# Patient Record
Sex: Female | Born: 1955 | Race: White | Hispanic: No | Marital: Single | Smoking: Former smoker
Health system: Southern US, Community
[De-identification: ages and names within clinical notes are randomized; demographics above are authoritative.]

## PROBLEM LIST (undated history)

## (undated) DIAGNOSIS — Z8619 Personal history of other infectious and parasitic diseases: Secondary | ICD-10-CM

## (undated) HISTORY — PX: CARPAL TUNNEL RELEASE: SHX101

## (undated) HISTORY — PX: REPLACEMENT TOTAL KNEE BILATERAL: SUR1225

## (undated) HISTORY — PX: ABDOMINAL HYSTERECTOMY: SHX81

## (undated) HISTORY — DX: Personal history of other infectious and parasitic diseases: Z86.19

---

## 1997-11-22 ENCOUNTER — Other Ambulatory Visit: Admission: RE | Admit: 1997-11-22 | Discharge: 1997-11-22 | Payer: Self-pay | Admitting: Gynecology

## 1999-08-21 ENCOUNTER — Encounter: Payer: Self-pay | Admitting: Gynecology

## 1999-08-21 ENCOUNTER — Encounter: Admission: RE | Admit: 1999-08-21 | Discharge: 1999-08-21 | Payer: Self-pay | Admitting: Gynecology

## 2004-09-15 ENCOUNTER — Other Ambulatory Visit: Admission: RE | Admit: 2004-09-15 | Discharge: 2004-09-15 | Payer: Self-pay | Admitting: Gynecology

## 2004-09-18 ENCOUNTER — Ambulatory Visit (HOSPITAL_COMMUNITY): Admission: RE | Admit: 2004-09-18 | Discharge: 2004-09-18 | Payer: Self-pay | Admitting: Gynecology

## 2004-12-14 ENCOUNTER — Inpatient Hospital Stay (HOSPITAL_COMMUNITY): Admission: RE | Admit: 2004-12-14 | Discharge: 2004-12-17 | Payer: Self-pay | Admitting: Orthopedic Surgery

## 2005-12-10 ENCOUNTER — Ambulatory Visit (HOSPITAL_COMMUNITY): Admission: RE | Admit: 2005-12-10 | Discharge: 2005-12-10 | Payer: Self-pay | Admitting: Gynecology

## 2005-12-13 ENCOUNTER — Other Ambulatory Visit: Admission: RE | Admit: 2005-12-13 | Discharge: 2005-12-13 | Payer: Self-pay | Admitting: Gynecology

## 2007-01-12 ENCOUNTER — Ambulatory Visit (HOSPITAL_COMMUNITY): Admission: RE | Admit: 2007-01-12 | Discharge: 2007-01-12 | Payer: Self-pay | Admitting: Gynecology

## 2007-01-18 ENCOUNTER — Encounter: Admission: RE | Admit: 2007-01-18 | Discharge: 2007-01-18 | Payer: Self-pay | Admitting: Gynecology

## 2007-01-24 ENCOUNTER — Other Ambulatory Visit: Admission: RE | Admit: 2007-01-24 | Discharge: 2007-01-24 | Payer: Self-pay | Admitting: Gynecology

## 2008-11-18 ENCOUNTER — Inpatient Hospital Stay (HOSPITAL_COMMUNITY): Admission: RE | Admit: 2008-11-18 | Discharge: 2008-11-20 | Payer: Self-pay | Admitting: Orthopedic Surgery

## 2009-01-09 ENCOUNTER — Ambulatory Visit (HOSPITAL_COMMUNITY): Admission: RE | Admit: 2009-01-09 | Discharge: 2009-01-09 | Payer: Self-pay | Admitting: Gynecology

## 2010-10-20 LAB — DIFFERENTIAL
Basophils Absolute: 0 10*3/uL (ref 0.0–0.1)
Eosinophils Absolute: 0.2 10*3/uL (ref 0.0–0.7)
Eosinophils Relative: 3 % (ref 0–5)
Lymphocytes Relative: 29 % (ref 12–46)
Lymphs Abs: 1.8 10*3/uL (ref 0.7–4.0)
Neutro Abs: 3.7 10*3/uL (ref 1.7–7.7)
Neutrophils Relative %: 59 % (ref 43–77)

## 2010-10-20 LAB — BASIC METABOLIC PANEL
BUN: 12 mg/dL (ref 6–23)
BUN: 7 mg/dL (ref 6–23)
CO2: 28 mEq/L (ref 19–32)
Calcium: 8.7 mg/dL (ref 8.4–10.5)
Calcium: 8.7 mg/dL (ref 8.4–10.5)
Creatinine, Ser: 0.76 mg/dL (ref 0.4–1.2)
GFR calc Af Amer: >60 mL/min (ref >60)
GFR calc non Af Amer: >60 mL/min (ref >60)
GFR calc non Af Amer: >60 mL/min (ref >60)
Glucose, Bld: 115 mg/dL — ABNORMAL HIGH (ref 70–99)
Sodium: 138 mEq/L (ref 135–145)
Sodium: 140 mEq/L (ref 135–145)

## 2010-10-20 LAB — URINALYSIS, ROUTINE W REFLEX MICROSCOPIC
Bilirubin Urine: NEGATIVE
Glucose, UA: NEGATIVE mg/dL
Ketones, ur: NEGATIVE mg/dL
Protein, ur: NEGATIVE mg/dL
Urobilinogen, UA: 0.2 mg/dL (ref 0.0–1.0)

## 2010-10-20 LAB — COMPREHENSIVE METABOLIC PANEL
Albumin: 3.8 g/dL (ref 3.5–5.2)
Alkaline Phosphatase: 86 U/L (ref 39–117)
BUN: 15 mg/dL (ref 6–23)
CO2: 27 mEq/L (ref 19–32)
Calcium: 9.5 mg/dL (ref 8.4–10.5)
Chloride: 110 mEq/L (ref 96–112)
Creatinine, Ser: 0.86 mg/dL (ref 0.4–1.2)
GFR calc Af Amer: >60 mL/min (ref >60)
GFR calc non Af Amer: >60 mL/min (ref >60)
Glucose, Bld: 107 mg/dL — ABNORMAL HIGH (ref 70–99)
Total Bilirubin: 0.3 mg/dL (ref 0.3–1.2)

## 2010-10-20 LAB — PROTIME-INR
INR: 1 (ref 0.00–1.49)
INR: 1.2 (ref 0.00–1.49)
INR: 1.3 (ref 0.00–1.49)
Prothrombin Time: 13.3 seconds (ref 11.6–15.2)
Prothrombin Time: 15.5 seconds — ABNORMAL HIGH (ref 11.6–15.2)
Prothrombin Time: 16.4 seconds — ABNORMAL HIGH (ref 11.6–15.2)

## 2010-10-20 LAB — CBC
HCT: 30.3 % — ABNORMAL LOW (ref 36.0–46.0)
HCT: 37.2 % (ref 36.0–46.0)
Hemoglobin: 12.8 g/dL (ref 12.0–15.0)
Hemoglobin: 9.6 g/dL — ABNORMAL LOW (ref 12.0–15.0)
MCHC: 34.5 g/dL (ref 30.0–36.0)
MCHC: 34.9 g/dL (ref 30.0–36.0)
MCHC: 35.1 g/dL (ref 30.0–36.0)
MCV: 85.1 fL (ref 78.0–100.0)
Platelets: 184 10*3/uL (ref 150–400)
Platelets: 239 10*3/uL (ref 150–400)
RBC: 3.22 MIL/uL — ABNORMAL LOW (ref 3.87–5.11)
RBC: 4.37 MIL/uL (ref 3.87–5.11)
RDW: 12.6 % (ref 11.5–15.5)
WBC: 6.4 10*3/uL (ref 4.0–10.5)
WBC: 7.9 10*3/uL (ref 4.0–10.5)
WBC: 9.7 10*3/uL (ref 4.0–10.5)

## 2010-10-20 LAB — TYPE AND SCREEN: Antibody Screen: NEGATIVE

## 2010-10-20 LAB — ABO/RH: ABO/RH(D): B POS

## 2010-10-20 LAB — URINE CULTURE

## 2010-11-24 NOTE — Op Note (Signed)
NAME:  Meghan Martinez, Meghan Martinez                   ACCOUNT NO.:  [id number]   MEDICAL RECORD NO.:  [id number]          PATIENT TYPE:  INP   LOCATION:  5024                         FACILITY:  [facility]   PHYSICIAN:  Robert A. Thurston Hole, M.D. DATE OF BIRTH:  Aug 13, 1955   DATE OF PROCEDURE:  11/18/2008  DATE OF DISCHARGE:                               OPERATIVE REPORT   PREOPERATIVE DIAGNOSIS:  Right knee degenerative joint disease.   POSTOPERATIVE DIAGNOSIS:  Right knee degenerative joint disease.   PROCEDURE:  Right total knee replacement using DePuy cemented total knee  system with #4 cemented femur and #4 cemented tibia with 12.5-mm  polyethylene RP tibial spacer and 35-mm polyethylene cemented patella.   SURGEON:  Elana Alm. Thurston Hole, MD   ASSISTANT:  Julien Girt, PA   ANESTHESIA:  General.   OPERATIVE TIME:  One hour and 30 minutes.   COMPLICATIONS:  None.   DESCRIPTION OF PROCEDURE:  Ms. Punch was brought to the operating room on  Nov 18, 2008.  After a femoral nerve block, was placed in the holding  room by anesthesia.  She was placed in the operative table in supine  position.  After being placed under general anesthesia, she received  Ancef 2 g IV preoperatively for prophylaxis.  She had a Foley catheter  placed under sterile conditions.  Her right knee was examined under  anesthesia.  Range of motion from -5 to 125 degrees.  Moderate varus  deformity, knee stable, and ligamentous exam with normal patellar  tracking.  The right leg was prepped using sterile DuraPrep and draped  using sterile technique.  Originally, through a 15-cm longitudinal  incision based over the patella, initial exposure was made.  The  underlying subcutaneous tissues were incised along with skin incision.  A median arthrotomy was performed revealing an excessive amount of  normal-appearing joint fluid.  The articular surfaces were inspected.  She had grade 4 changes medially, laterally, and in the patellofemoral  joint.  Osteophytes removed from the femoral condyles and tibial  plateau.  The medial and lateral meniscal remnants were removed as well  as the anterior cruciate ligament.  An intramedullary drill was then  drilled up the femoral canal for placement of the distal femoral cutting  jig, which was placed in the appropriate manner rotation and a distal 11  mm cut was made.  The distal femur was then sized.  A #4 was found to be  the appropriate size.  A #4 cutting jig was then placed in the  appropriate manner of external rotation and then these cuts were made.  The proximal tibia was then exposed.  The tibial spines were removed  with an oscillating saw.  Intramedullary drill was drilled down the  tibial canal for placement of proximal tibial cutting jig, which was  placed in the appropriate manner rotation, and a proximal 6 mm cut was  made based off the medial or lower side.  Spacer blocks were then placed  in flexion and extension.  A 12.5 mm blocks gave excellent balancing,  excellent stability, and excellent correction  of her flexion and varus  deformities.  A #4 tibial base plate trial was placed on the cut tibial  surface with an excellent fit, and the keel cut was made.  The PCL box  cutter was then placed on the distal femur and these cuts were made.  At  this point, the #4 tibial base plate trial was placed and #4 femoral  trial was placed in with a 12.5-mm polyethylene tibial spacer, knee was  reduced, taken through range of motion from 0-125 degrees with excellent  stability and excellent correction of her flexion and varus deformities,  and normal patella tracking.  A resurfacing 10-mm cut was then made on  the patella and 3 locking holes placed for a 35 mm patella.  The patella  trial was placed and again patellofemoral tracking was evaluated and  found to be normal.  At this point, it was felt that all trial  components were of excellent size, fit, and stability.  They were  then  removed.  The knee was then jet lavaged and irrigated with 3 L of  saline.  The proximal tibia was then exposed.  A #4 tibial baseplate  with cement backing was hammered into position with an excellent fit  with excess cement being removed from around the edges.  A #4 femoral  component with cement backing was hammered into position also with an  excellent fit with excess cement being removed from around the edges.  A  12.5-mm polyethylene RP tibial spacer was placed on tibial baseplate,  the knee reduced, taken through range of motion from 0-125 degrees with  excellent stability and excellent correction of her flexion and varus  deformities.  The 35-mm polyethylene cement backed patella was then  placed in its position and held there with a clamp.  After the cement  hardened, again patellofemoral tracking was evaluated and found to be  normal.  At this point, it was felt that all components were of  excellent size, fit, and stability.  The knee was further irrigated with  saline.  The tourniquet was released.  Hemostasis was obtained with  cautery.  The arthrotomy was then closed with #1 Ethibond suture over 2  medium Hemovac drains.  Subcutaneous tissues were closed with 0 and 2-0  Vicryl.  Subcuticular layer closed with 4-0 Monocryl.  Sterile dressings  were applied.  Fabrizio-leg splint applied.  The patient then awakened,  extubated, and taken to recovery room in stable condition.  Needle and  sponge counts correct x2 at the end of case.  Neurovascular status  normal.  Pulses 2+ and symmetric.      Robert A. Thurston Hole, M.D.  Electronically Signed     RAW/MEDQ  D:  11/18/2008  T:  11/18/2008  Job:  [id number]

## 2010-11-27 NOTE — Op Note (Signed)
NAME:  Meghan Martinez, Meghan Martinez                   ACCOUNT NO.:  [id number]   MEDICAL RECORD NO.:  [id number]          PATIENT TYPE:  INP   LOCATION:  5018                         FACILITY:  [facility]   PHYSICIAN:  Robert A. Thurston Hole, M.D. DATE OF BIRTH:  Jun 14, 1956   DATE OF PROCEDURE:  12/14/2004  DATE OF DISCHARGE:                                 OPERATIVE REPORT   PREOPERATIVE DIAGNOSIS:  Left knee degenerative joint disease.   POSTOPERATIVE DIAGNOSIS:  Left knee degenerative joint disease.   OPERATION PERFORMED:  1.  Left total knee replacement using Depuy cemented total knee system with      a #4 cemented femur, #4 cemented tibia with 15 mm polyethylene RP tibial      spacer and 38 mm polyethylene cemented patella.  2.  Left total knee computer assisted navigation.   SURGEON:  Elana Alm. Thurston Hole, M.D.   ASSISTANT:  Julien Girt, P.A.   ANESTHESIA:  General.   OPERATIVE TIME:  One hour and 45 minutes.   COMPLICATIONS:  None.   DESCRIPTION OF PROCEDURE:  [name] was brought to the operating room on  December 14, 2004 and placed on the operating table in supine position.  After an  adequate level of general anesthesia was obtained, she received Ancef 1 g IV  preoperatively for prophylaxis.  She had a Foley catheter placed under  sterile conditions.  Her left knee was examination under anesthesia.  Range  of motion from -10 to 105 degrees with significant varus deformity of 8 to  10 degrees.  Her knee was stable with normal patellar tracking.  Her left  leg was prepped using sterile DuraPrep and draped using technique.  The leg  was exsanguinated and a thigh tourniquet elevated 375 mm.  Initially,  through a 12 cm longitudinal incision based over the patella, initial  exposure was made.  The underlying subcutaneous tissues were incised in line  with the skin incision.  A median arthrotomy was performed revealing an  excessive amount of normal-appearing joint fluid.  The articular surfaces  were inspected.  She had grade 4 changes medially, laterally and in the  patellofemoral joint.  She had very large osteophytes on the femoral  condyles and tibial plateau and these were removed.  Medial and lateral  meniscal remnants were removed as well as pieces of the anterior cruciate  ligament of which only part of this remained.  After this was done, two pins  were placed in the distal femoral shaft and two other pins were placed in  the proximal tibial shaft for placement of the computer assisted navigation  devices.  These devices were then activated.  Initial measurements from this  showed approximately 10 degrees of varus deformity and 10 to 12 degrees of  flexion contracture.  At this point then using the computer assisted  navigation, a distal 12 mm cut was made on the distal femur.  The distal  femur was incised.  #4 was found to be the appropriate size.  The #4 cutting  jig was placed and then these cuts were made  and verified again with  computer navigation showing excellent and perfect cuts.  After this was  done, then the proximal tibia was exposed.  Using again a computer  navigation system, the proximal tibial cut was made removing 6.5 mm from the  medial or lower side which equaled 10 mm from the lateral side again with  appropriate and accurate cuts made using the computer navigation system.  At  this point then spacer guides were used to test flexion and extension gaps  and with a 15 mm gap spacer there was found to be excellent balance.  At  this point then the proximal tibial keel cut was made with the #4 tibial  tray trial.  After this was done then the PCL box cutter was placed on the  distal femur and these cuts were made.  At this point then the #4 femoral  trial was placed and with a #4 tibial base plate trial placed in a 15 mm  polyethylene RP tibial spacer.  The knee was taken through a full range of  motion, found to have excellent correction of her varus  deformity and her  flexion contracture to within 1 degree of neutral.  The knee was found to be  stable.  The patella was then sized.  38 mm was found to be the appropriate  size and a resurfacing cut was made and three locking holes were placed. At  this point then patellofemoral tracking was evaluated and this was found to  be normal.  At this point it was felt that all of the trial components were  of excellent size, fit and stability.  They were then removed.  The knee was  then jet lavage irrigated with 3 liters of saline solution.  The proximal  tibia was then exposed and a #4 tibial baseplate with cement backing was  hammered into position with an excellent fit with excess cement being  removed from around the edges.  The #4 femoral component with cement backing  was hammered into position also with an excellent fit with excess cement  being removed from around the edges.  The 15 mm polyethylene RP tibial  spacer was then placed on the tibial base plate.  The knee taken through a  range of motion of motion from 0 to 120 degrees with excellent stability and  excellent correction of her varus deformity and of her flexion contracture.  The 38 mm polyethylene cement backed patella was then placed onto its cut  surface with three pegs in good position and held in place with a clamp.  After the cement had hardened, the patellofemoral tracking was again  evaluated.  This was found to be normal.  At this point it was felt that all  components were of excellent size, fit and stability.  The pins for the  navigation system were removed.  The wounds were further irrigated with  saline.  The tourniquet was released.  Hemostasis was obtained with cautery.  Then the arthrotomy was closed with #1 Ethibond suture over two medium  Hemovac drains.  Subcutaneous tissue was closed with 0 and 2-0 Vicryl.  Skin  closed with skin staples.  Sterile dressings were applied and a Molock leg splint.  The patient  had had a femoral nerve block placed by anesthesia for  postoperative pain control.  She was then awakened, extubated and taken to  recovery room in stable condition.  Sponge and needle counts were correct  times two at the end  of this case.       RAW/MEDQ  D:  12/14/2004  T:  12/14/2004  Job:  [id number]

## 2010-11-27 NOTE — Discharge Summary (Signed)
NAME:  Meghan Martinez, Meghan Martinez                   ACCOUNT NO.:  [id number]   MEDICAL RECORD NO.:  [id number]          PATIENT TYPE:  INP   LOCATION:  5018                         FACILITY:  [facility]   PHYSICIAN:  Robert A. Thurston Hole, M.D. DATE OF BIRTH:  1956/05/08   DATE OF ADMISSION:  12/14/2004  DATE OF DISCHARGE:  12/17/2004                                 DISCHARGE SUMMARY   ADMITTING DIAGNOSIS:  End-stage degenerative joint disease, left knee.   DISCHARGE DIAGNOSIS:  End-stage degenerative joint disease, left knee,  status post left total knee replacement.   HISTORY OF PRESENT ILLNESS:  The patient is a 55 year old white female who  has a history of end-stage DJD of her left knee.  She has tried conservative  care including anti-inflammatories and an arthroscopic debridement.  She  understands the risks, benefits and possible complications of a left total  knee replacement and is without question.   PROCEDURES IN-HOUSE:  On December 14, 2004, the patient underwent a left total  knee replacement by Dr. Thurston Hole as well as a femoral nerve block by  Anesthesia.  She tolerated both procedures well.  Postoperatively, she was  admitted for pain control, DVT prophylaxis and physical therapy.  Postop day  1, the patient was alert and oriented.  She did CPM 0-90.  She did physical  therapy.  Her hemoglobin was 10.6.  She was metabolically stable.  She was  afebrile.  Her dressing was changed.  Her PCA was discontinued.  She was  placed on Percocet for pain.  Postop day 2, T-max of 101.1; temperature on  exam was 98.4.  Hemoglobin was 9.8.  INR 1.1.  She ambulated 50 feet.  Her  range of motion was 0-65.  Discharge planning was made for postop day 3.  On  postop day 3, the patient did have another temperature of 101.6.  Her  hemoglobin was 8.8.  Her UA did show a positive bacteria.  Chest x-ray was  negative.  Blood cultures showed no growth.  Urine culture showed no growth.  She was sent home on Cipro 500 mg one  p.o. b.i.d. for 10 days.  She will  follow up with Dr. Thurston Hole in 10 days for sutures out and x-ray.   DISCHARGE MEDICATIONS:  1.  Percocet 5/325 mg one to two q.4-6 h. p.r.n. pain.  2.  Coumadin 5 mg two tablets a day, recheck on Friday.  3.  Cipro 500 mg one tablet 2 times a day.  4.  Colace 100 mg one tablet twice a day.   FOLLOWUP:  She will follow up with Dr. Thurston Hole on December 28, 2004; she will  call for an appointment time.  Home health will draw her PT/INR on Monday.     Kirstin Shepperson, P.A.      Robert A. Thurston Hole, M.D.  Electronically Signed   KS/MEDQ  D:  03/11/2005  T:  03/11/2005  Job:  [id number]

## 2012-05-10 ENCOUNTER — Other Ambulatory Visit (HOSPITAL_COMMUNITY): Payer: Self-pay | Admitting: Gynecology

## 2012-05-10 DIAGNOSIS — Z1231 Encounter for screening mammogram for malignant neoplasm of breast: Secondary | ICD-10-CM

## 2012-05-29 ENCOUNTER — Ambulatory Visit (HOSPITAL_COMMUNITY)
Admission: RE | Admit: 2012-05-29 | Discharge: 2012-05-29 | Disposition: A | Discharge status: Home / Self Care | Payer: BC Managed Care – PPO | Source: Ambulatory Visit | Attending: Gynecology | Admitting: Gynecology

## 2012-05-29 DIAGNOSIS — Z1231 Encounter for screening mammogram for malignant neoplasm of breast: Secondary | ICD-10-CM | POA: Insufficient documentation

## 2014-05-28 ENCOUNTER — Other Ambulatory Visit (HOSPITAL_COMMUNITY): Payer: Self-pay | Admitting: Gynecology

## 2014-05-28 DIAGNOSIS — Z1231 Encounter for screening mammogram for malignant neoplasm of breast: Secondary | ICD-10-CM

## 2014-06-03 ENCOUNTER — Ambulatory Visit (HOSPITAL_COMMUNITY)
Admission: RE | Admit: 2014-06-03 | Discharge: 2014-06-03 | Disposition: A | Discharge status: Home / Self Care | Payer: BC Managed Care – PPO | Source: Ambulatory Visit | Attending: Gynecology | Admitting: Gynecology

## 2014-06-03 DIAGNOSIS — Z1231 Encounter for screening mammogram for malignant neoplasm of breast: Secondary | ICD-10-CM | POA: Insufficient documentation

## 2014-08-22 DIAGNOSIS — Z800 Family history of malignant neoplasm of digestive organs: Secondary | ICD-10-CM | POA: Insufficient documentation

## 2014-08-22 DIAGNOSIS — Z8249 Family history of ischemic heart disease and other diseases of the circulatory system: Secondary | ICD-10-CM | POA: Insufficient documentation

## 2015-06-13 ENCOUNTER — Other Ambulatory Visit: Payer: Self-pay

## 2015-06-13 DIAGNOSIS — Z1231 Encounter for screening mammogram for malignant neoplasm of breast: Secondary | ICD-10-CM

## 2015-07-01 ENCOUNTER — Ambulatory Visit
Admission: RE | Admit: 2015-07-01 | Discharge: 2015-07-01 | Disposition: A | Discharge status: Home / Self Care | Payer: BC Managed Care – PPO | Source: Ambulatory Visit

## 2015-07-01 ENCOUNTER — Other Ambulatory Visit: Payer: Self-pay

## 2015-07-01 ENCOUNTER — Other Ambulatory Visit: Payer: Self-pay | Admitting: Family Medicine

## 2015-07-01 DIAGNOSIS — Z1231 Encounter for screening mammogram for malignant neoplasm of breast: Secondary | ICD-10-CM

## 2015-07-01 DIAGNOSIS — N6310 Unspecified lump in the right breast, unspecified quadrant: Secondary | ICD-10-CM

## 2015-08-14 ENCOUNTER — Ambulatory Visit
Admission: RE | Admit: 2015-08-14 | Discharge: 2015-08-14 | Disposition: A | Discharge status: Home / Self Care | Payer: BC Managed Care – PPO | Source: Ambulatory Visit | Attending: Family Medicine | Admitting: Family Medicine

## 2015-08-14 DIAGNOSIS — N6310 Unspecified lump in the right breast, unspecified quadrant: Secondary | ICD-10-CM

## 2015-11-24 LAB — HM COLONOSCOPY

## 2016-02-13 ENCOUNTER — Other Ambulatory Visit: Payer: Self-pay | Admitting: Family Medicine

## 2016-02-13 DIAGNOSIS — N6310 Unspecified lump in the right breast, unspecified quadrant: Secondary | ICD-10-CM

## 2016-02-19 ENCOUNTER — Ambulatory Visit
Admission: RE | Admit: 2016-02-19 | Discharge: 2016-02-19 | Disposition: A | Discharge status: Home / Self Care | Payer: BC Managed Care – PPO | Source: Ambulatory Visit | Attending: Family Medicine | Admitting: Family Medicine

## 2016-02-19 ENCOUNTER — Other Ambulatory Visit: Payer: Self-pay | Admitting: Family Medicine

## 2016-02-19 DIAGNOSIS — N6310 Unspecified lump in the right breast, unspecified quadrant: Secondary | ICD-10-CM

## 2016-03-17 DIAGNOSIS — K635 Polyp of colon: Secondary | ICD-10-CM | POA: Insufficient documentation

## 2016-03-17 LAB — HM HEPATITIS C SCREENING LAB: HM Hepatitis Screen: NEGATIVE

## 2016-10-20 ENCOUNTER — Other Ambulatory Visit: Payer: Self-pay | Admitting: Family Medicine

## 2016-10-20 DIAGNOSIS — Z1231 Encounter for screening mammogram for malignant neoplasm of breast: Secondary | ICD-10-CM

## 2016-11-08 ENCOUNTER — Ambulatory Visit
Admission: RE | Admit: 2016-11-08 | Discharge: 2016-11-08 | Disposition: A | Discharge status: Home / Self Care | Payer: BC Managed Care – PPO | Source: Ambulatory Visit | Attending: Family Medicine | Admitting: Family Medicine

## 2016-11-08 DIAGNOSIS — Z1231 Encounter for screening mammogram for malignant neoplasm of breast: Secondary | ICD-10-CM

## 2016-11-08 LAB — HM MAMMOGRAPHY: HM Mammogram: NORMAL (ref 0–4)

## 2017-09-15 ENCOUNTER — Other Ambulatory Visit: Payer: Self-pay

## 2017-09-15 ENCOUNTER — Ambulatory Visit: Payer: BC Managed Care – PPO | Admitting: Family Medicine

## 2017-09-15 ENCOUNTER — Encounter: Payer: Self-pay | Admitting: Family Medicine

## 2017-09-15 VITALS — BP 120/62 | HR 89 | Temp 98.1°F | Resp 16 | Ht 69.0 in | Wt 262.0 lb

## 2017-09-15 DIAGNOSIS — M65312 Trigger thumb, left thumb: Secondary | ICD-10-CM

## 2017-09-15 DIAGNOSIS — M21611 Bunion of right foot: Secondary | ICD-10-CM | POA: Diagnosis not present

## 2017-09-15 DIAGNOSIS — M2142 Flat foot [pes planus] (acquired), left foot: Secondary | ICD-10-CM

## 2017-09-15 DIAGNOSIS — M79671 Pain in right foot: Secondary | ICD-10-CM | POA: Diagnosis not present

## 2017-09-15 DIAGNOSIS — M2141 Flat foot [pes planus] (acquired), right foot: Secondary | ICD-10-CM

## 2017-09-15 DIAGNOSIS — N3946 Mixed incontinence: Secondary | ICD-10-CM | POA: Diagnosis not present

## 2017-09-15 MED ORDER — SOLIFENACIN SUCCINATE 5 MG PO TABS: 5.0000 mg | ORAL_TABLET | Freq: Every day | ORAL | 5 refills | Status: DC

## 2017-09-15 NOTE — Patient Instructions (Addendum)
It was so good seeing you again! Thank you for establishing with my new practice and allowing me to continue caring for you. It means a lot to me.   Please schedule a follow up appointment with me in 3-6 months for your complete physical. Also appt in 2 weeks for steroid injection in thumb and f/u for bladder medication.     Trigger Finger Trigger finger (stenosing tenosynovitis) is a condition that causes a finger to get stuck in a bent position. Each finger has a tough, cord-like tissue that connects muscle to bone (tendon), and each tendon is surrounded by a tunnel of tissue (tendon sheath). To move your finger, your tendon needs to slide freely through the sheath. Trigger finger happens when the tendon or the sheath thickens, making it difficult to move your finger. Trigger finger can affect any finger or a thumb. It may affect more than one finger. Mild cases may clear up with rest and medicine. Severe cases require more treatment. What are the causes? Trigger finger is caused by a thickened finger tendon or tendon sheath. The cause of this thickening is not known. What increases the risk? The following factors may make you more likely to develop this condition:  Doing activities that require a strong grip.  Having rheumatoid arthritis, gout, or diabetes.  Being 62-62 years old.  Being a woman.  What are the signs or symptoms? Symptoms of this condition include:  Pain when bending or straightening your finger.  Tenderness or swelling where your finger attaches to the palm of your hand.  A lump in the palm of your hand or on the inside of your finger.  Hearing a popping sound when you try to straighten your finger.  Feeling a popping, catching, or locking sensation when you try to straighten your finger.  Being unable to straighten your finger.  How is this diagnosed? This condition is diagnosed based on your symptoms and a physical exam. How is this treated? This  condition may be treated by:  Resting your finger and avoiding activities that make symptoms worse.  Wearing a finger splint to keep your finger in a slightly bent position.  Taking NSAIDs to relieve pain and swelling.  Injecting medicine (steroids) into the tendon sheath to reduce swelling and irritation. Injections may need to be repeated. Having surgery to open the tendon sheath. This may be done if other treatments do not work and you cannot straighten your finger. You may need physical therapy after sur Trigger Finger Trigger finger (stenosing tenosynovitis) is a condition that causes a finger to get stuck in a bent position. Each finger has a tough, cord-like tissue that connects muscle to bone (tendon), and each tendon is surrounded by a tunnel of tissue (tendon sheath). To move your finger, your tendon needs to slide freely through the sheath. Trigger finger happens when the tendon or the sheath thickens, making it difficult to move your finger. Trigger finger can affect any finger or a thumb. It may affect more than one finger. Mild cases may clear up with rest and medicine. Severe cases require more treatment. What are the causes? Trigger finger is caused by a thickened finger tendon or tendon sheath. The cause of this thickening is not known. What increases the risk? The following factors may make you more likely to develop this condition: Doing activities that require a strong grip. Having rheumatoid arthritis, gout, or diabetes. Being 62-62 years old. Being a woman.  What are the signs or symptoms?  Symptoms of this condition include: Pain when bending or straightening your finger. Tenderness or swelling where your finger attaches to the palm of your hand. A lump in the palm of your hand or on the inside of your finger. Hearing a popping sound when you try to straighten your finger. Feeling a popping, catching, or locking sensation when you try to straighten your  finger. Being unable to straighten your finger.  How is this diagnosed? This condition is diagnosed based on your symptoms and a physical exam. How is this treated? This condition may be treated by: Resting your finger and avoiding activities that make symptoms worse. Wearing a finger splint to keep your finger in a slightly bent position. Taking NSAIDs to relieve pain and swelling. Injecting medicine (steroids) into the tendon sheath to reduce swelling and irritation. Injections may need to be repeated. Having surgery to open the tendon sheath. This may be done if other treatments do not work and you cannot straighten your finger. You may need physical therapy after surgery.  Follow these instructions at home: Use moist heat to help reduce pain and swelling as told by your health care provider. Rest your finger and avoid activities that make pain worse. Return to normal activities as told by your health care provider. If you have a splint, wear it as told by your health care provider. Take over-the-counter and prescription medicines only as told by your health care provider. Keep all follow-up visits as told by your health care provider. This is important. Contact a health care provider if: Your symptoms are not improving with home care. Summary Trigger finger (stenosing tenosynovitis) causes your finger to get stuck in a bent position, and it can make it difficult and painful to straighten your finger. This condition develops when a finger tendon or tendon sheath thickens. Treatment starts with resting, wearing a splint, and taking NSAIDs. In severe cases, surgery to open the tendon sheath may be needed. This information is not intended to replace advice given to you by your health care provider. Make sure you discuss any questions you have with your health care provider. Document Released: 04/17/2004 Document Revised: 06/08/2016 Document Reviewed: 06/08/2016 Elsevier Interactive  Patient Education  2017 [employer].  gery.  Follow these instructions at home:  Use moist heat to help reduce pain and swelling as told by your health care provider.  Rest your finger and avoid activities that make pain worse. Return to normal activities as told by your health care provider.  If you have a splint, wear it as told by your health care provider.  Take over-the-counter and prescription medicines only as told by your health care provider.  Keep all follow-up visits as told by your health care provider. This is important. Contact a health care provider if:  Your symptoms are not improving with home care. Summary  Trigger finger (stenosing tenosynovitis) causes your finger to get stuck in a bent position, and it can make it difficult and painful to straighten your finger.  This condition develops when a finger tendon or tendon sheath thickens.  Treatment starts with resting, wearing a splint, and taking NSAIDs.  In severe cases, surgery to open the tendon sheath may be needed. This information is not intended to replace advice given to you by your health care provider. Make sure you discuss any questions you have with your health care provider. Document Released: 04/17/2004 Document Revised: 06/08/2016 Document Reviewed: 06/08/2016 Elsevier Interactive Patient Education  2017 [employer].  Urinary  Incontinence Urinary incontinence is the involuntary loss of urine from your bladder. What are the causes? There are many causes of urinary incontinence. They include:  Medicines.  Infections.  Prostatic enlargement, leading to overflow of urine from your bladder.  Surgery.  Neurological diseases.  Emotional factors.  What are the signs or symptoms? Urinary Incontinence can be divided into four types: 1. Urge incontinence. Urge incontinence is the involuntary loss of urine before you have the opportunity to go to the bathroom. There is a sudden urge to void  but not enough time to reach a bathroom. 2. Stress incontinence. Stress incontinence is the sudden loss of urine with any activity that forces urine to pass. It is commonly caused by anatomical changes to the pelvis and sphincter areas of your body. 3. Overflow incontinence. Overflow incontinence is the loss of urine from an obstructed opening to your bladder. This results in a backup of urine and a resultant buildup of pressure within the bladder. When the pressure within the bladder exceeds the closing pressure of the sphincter, the urine overflows, which causes incontinence, similar to water overflowing a dam. 4. Total incontinence. Total incontinence is the loss of urine as a result of the inability to store urine within your bladder.  How is this diagnosed? Evaluating the cause of incontinence may require:  A thorough and complete medical and obstetric history.  A complete physical exam.  Laboratory tests such as a urine culture and sensitivities.  When additional tests are indicated, they can include:  An ultrasound exam.  Kidney and bladder X-rays.  Cystoscopy. This is an exam of the bladder using a narrow scope.  Urodynamic testing to test the nerve function to the bladder and sphincter areas.  How is this treated? Treatment for urinary incontinence depends on the cause:  For urge incontinence caused by a bacterial infection, antibiotics will be prescribed. If the urge incontinence is related to medicines you take, your health care provider may have you change the medicine.  For stress incontinence, surgery to re-establish anatomical support to the bladder or sphincter, or both, will often correct the condition.  For overflow incontinence caused by an enlarged prostate, an operation to open the channel through the enlarged prostate will allow the flow of urine out of the bladder. In women with fibroids, a hysterectomy may be recommended.  For total incontinence, surgery on  your urinary sphincter may help. An artificial urinary sphincter (an inflatable cuff placed around the urethra) may be required. In women who have developed a hole-like passage between their bladder and vagina (vesicovaginal fistula), surgery to close the fistula often is required.  Follow these instructions at home:  Normal daily hygiene and the use of pads or adult diapers that are changed regularly will help prevent odors and skin damage.  Avoid caffeine. It can overstimulate your bladder.  Use the bathroom regularly. Try about every 2-3 hours to go to the bathroom, even if you do not feel the need to do so. Take time to empty your bladder completely. After urinating, wait a minute. Then try to urinate again.  For causes involving nerve dysfunction, keep a log of the medicines you take and a journal of the times you go to the bathroom. Contact a health care provider if:  You experience worsening of pain instead of improvement in pain after your procedure.  Your incontinence becomes worse instead of better. Get help right away if:  You experience fever or shaking chills.  You are unable to  pass your urine.  You have redness spreading into your groin or down into your thighs. This information is not intended to replace advice given to you by your health care provider. Make sure you discuss any questions you have with your health care provider. Document Released: 08/05/2004 Document Revised: 02/06/2016 Document Reviewed: 12/05/2012 Elsevier Interactive Patient Education  [employer].

## 2017-09-15 NOTE — Progress Notes (Signed)
Subjective  CC:  Chief Complaint  Patient presents with  . Establish Care    joint pain of left thumb, denies any injury    HPI: Meghan Martinez is a 62 y.o. female who presents to [facility] today to establish care with me as a new patient. She is a former [facility] patient and is here to reestablish care with me today.   She has the following concerns or needs:   Overall doing well. Due for CPE; last 03/2016  Has left thumb pain and locking; ongoing x several months. No injury but works with hands. Had carpal tunnel release in June and that is improved.   Mid foot pain and bunion; intermittent pain. Worse with standing. No ankle pain. "what can I do without surgery". Shoes don't fit well.   Leaking of urine x 1 year; has both urgency sxs and nocturia and mild stress sxs. No dysuria or irritative sxs. No gross hematuria. No sxs of prolapse. No rash. Denies sxs of hyperglycemia.   We updated and reviewed the patient's past history in detail and it is documented below.  Patient Active Problem List   Diagnosis Date Noted  . 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 Benign colon polyp 03/17/2016  . Family history of colon cancer 08/22/2014  . Family history of premature CAD 08/22/2014   Health Maintenance  Topic Date Due  . Hepatitis C Screening  Dec 26, 1955  . HIV Screening  09/22/1970  . TETANUS/TDAP  09/22/1974  . PAP SMEAR  09/21/1976  . COLONOSCOPY  09/21/2005  . MAMMOGRAM  08/13/2017  . INFLUENZA VACCINE  03/12/2018 (Originally 02/09/2017)   Immunization History  Administered Date(s) Administered  . Tdap 11/26/2014  . Zoster 03/17/2016   No outpatient medications have been marked as taking for the 09/15/17 encounter (Office Visit) with Willow Ora, MD.    Allergies: Patient has No Known Allergies. Past Medical History Patient  has a past medical history of History of chickenpox. Past Surgical History Patient  has a past  surgical history that includes Replacement total knee bilateral; Abdominal hysterectomy; and Carpal tunnel release (Left). Family History: Patient family history includes Cancer in her brother, mother, and sister; Lung disease in her father. Social History:  Patient  reports that she has quit smoking. Her smoking use included cigars. she has never used smokeless tobacco. She reports that she drinks alcohol. She reports that she does not use drugs.  Review of Systems: Constitutional: negative for fever or malaise Ophthalmic: negative for photophobia, double vision or loss of vision Cardiovascular: negative for chest pain, dyspnea on exertion, or new LE swelling Respiratory: negative for SOB or persistent cough Gastrointestinal: negative for abdominal pain, change in bowel habits or melena Genitourinary: negative for dysuria or gross hematuria Musculoskeletal: negative for new gait disturbance or muscular weakness Integumentary: negative for new or persistent rashes Neurological: negative for TIA or stroke symptoms Psychiatric: negative for SI or delusions Allergic/Immunologic: negative for hives  Patient Care Team    Relationship Specialty Notifications Start End  Willow Ora, MD PCP - General Family Medicine  09/15/17   Ditty, Loura Halt, MD Consulting Physician Neurosurgery  09/15/17     Objective  Vitals: BP 120/62   Pulse 89   Temp 98.1 F (36.7 C) (Oral)   Resp 16   Ht 5\' 9"  (1.753 m)   Wt 262 lb (118.8 kg)   SpO2 98%   BMI 38.69 kg/m  General:  Well developed, well nourished, no acute distress  Psych:  Alert and oriented,normal mood and affect MSK: no deformities, contusions. Joints are without erythema or swelling Left 1st MCP with ttp and palpable nodule; + locking Right foot with nontender bunion, fallen arch and + squeeze test. No localized bony ttp, flat footed Skin:  Warm, no rashes or suspicious lesions noted  Assessment  1. Mixed stress and urge urinary  incontinence   2. Trigger finger of left thumb   3. Pain of midfoot, right   4. Bunion, right foot   5. Fallen arches      Plan   Counseled on mgt options for incontinence: rec vesicare and follow. Start kegels. If can't get improved, urology. Pt defers for now.   Trigger finger: return for steroid injection and spinting.   Foot pain related to fallen arches, start with arch support and nsaids. Consider sports med for orthotics. Pt deferred xray; consider stress fracture. Doubt pain is related to bunion.   Follow up:  Return in about 2 weeks (around 09/29/2017) for trigger finger f/u and OAB f/u.  Commons side effects, risks, benefits, and alternatives for medications and treatment plan prescribed today were discussed, and the patient expressed understanding of the given instructions. Patient is instructed to call or message via MyChart if he/she has any questions or concerns regarding our treatment plan. No barriers to understanding were identified. We discussed Red Flag symptoms and signs in detail. Patient expressed understanding regarding what to do in case of urgent or emergency type symptoms.   Medication list was reconciled, printed and provided to the patient in AVS. Patient instructions and summary information was reviewed with the patient as documented in the AVS. This note was prepared with assistance of Dragon voice recognition software. Occasional wrong-word or sound-a-like substitutions may have occurred due to the inherent limitations of voice recognition software  Orders Placed This Encounter  Procedures  . HM MAMMOGRAPHY  . HM HEPATITIS C SCREENING LAB  . HM COLONOSCOPY   Meds ordered this encounter  Medications  . solifenacin (VESICARE) 5 MG tablet    Sig: Take 1 tablet (5 mg total) by mouth daily.    Dispense:  30 tablet    Refill:  5

## 2017-09-28 ENCOUNTER — Other Ambulatory Visit: Payer: Self-pay

## 2017-09-28 ENCOUNTER — Ambulatory Visit: Payer: BC Managed Care – PPO | Admitting: Family Medicine

## 2017-09-28 ENCOUNTER — Encounter: Payer: Self-pay | Admitting: Family Medicine

## 2017-09-28 VITALS — BP 134/72 | HR 74 | Temp 97.7°F | Resp 16 | Ht 69.0 in | Wt 264.2 lb

## 2017-09-28 DIAGNOSIS — M65312 Trigger thumb, left thumb: Secondary | ICD-10-CM | POA: Diagnosis not present

## 2017-09-28 NOTE — Progress Notes (Signed)
Trigger Finger Steroid Injection Procedure Note  Indications: The patient has symptoms of trigger digit that causes pain and locking. Left thumb. See last OV note for description and exam.   Pre-operative Diagnosis: left 1st Trigger Finger  Post-operative Diagnosis: same   Procedure Details  A Time Out was held and the above information confirmed. The skin was prepped with alcohol and cold spray was use for anesthesia. The symptomatic trigger finger/nodule was injected with 0.605ml of Kenalog 40mg /ml and 0.595ml of 1% lidocaine. No complications.   Return if symptoms worsen or fail to improve.

## 2017-11-09 ENCOUNTER — Other Ambulatory Visit: Payer: Self-pay | Admitting: Family Medicine

## 2017-11-09 DIAGNOSIS — Z1231 Encounter for screening mammogram for malignant neoplasm of breast: Secondary | ICD-10-CM

## 2017-11-11 ENCOUNTER — Ambulatory Visit
Admission: RE | Admit: 2017-11-11 | Discharge: 2017-11-11 | Disposition: A | Discharge status: Home / Self Care | Payer: BC Managed Care – PPO | Source: Ambulatory Visit | Attending: Family Medicine | Admitting: Family Medicine

## 2017-11-11 DIAGNOSIS — Z1231 Encounter for screening mammogram for malignant neoplasm of breast: Secondary | ICD-10-CM

## 2017-12-20 ENCOUNTER — Encounter: Payer: BC Managed Care – PPO | Admitting: Family Medicine

## 2017-12-22 ENCOUNTER — Ambulatory Visit (INDEPENDENT_AMBULATORY_CARE_PROVIDER_SITE_OTHER): Payer: BC Managed Care – PPO | Admitting: Family Medicine

## 2017-12-22 ENCOUNTER — Ambulatory Visit (INDEPENDENT_AMBULATORY_CARE_PROVIDER_SITE_OTHER): Payer: BC Managed Care – PPO

## 2017-12-22 ENCOUNTER — Other Ambulatory Visit: Payer: Self-pay

## 2017-12-22 ENCOUNTER — Encounter: Payer: Self-pay | Admitting: Family Medicine

## 2017-12-22 VITALS — BP 116/84 | HR 74 | Temp 98.0°F | Resp 15 | Ht 69.0 in | Wt 263.2 lb

## 2017-12-22 DIAGNOSIS — M2142 Flat foot [pes planus] (acquired), left foot: Secondary | ICD-10-CM | POA: Diagnosis not present

## 2017-12-22 DIAGNOSIS — N3946 Mixed incontinence: Secondary | ICD-10-CM

## 2017-12-22 DIAGNOSIS — R0789 Other chest pain: Secondary | ICD-10-CM

## 2017-12-22 DIAGNOSIS — Z0000 Encounter for general adult medical examination without abnormal findings: Secondary | ICD-10-CM

## 2017-12-22 DIAGNOSIS — M79671 Pain in right foot: Secondary | ICD-10-CM

## 2017-12-22 DIAGNOSIS — M2141 Flat foot [pes planus] (acquired), right foot: Secondary | ICD-10-CM

## 2017-12-22 LAB — CBC WITH DIFFERENTIAL/PLATELET
Basophils Absolute: 0.1 10*3/uL (ref 0.0–0.1)
Basophils Relative: 1 % (ref 0.0–3.0)
Eosinophils Absolute: 0.1 10*3/uL (ref 0.0–0.7)
Eosinophils Relative: 1.9 % (ref 0.0–5.0)
HCT: 38.5 % (ref 36.0–46.0)
HEMOGLOBIN: 13.1 g/dL (ref 12.0–15.0)
Lymphocytes Relative: 15.7 % (ref 12.0–46.0)
Lymphs Abs: 0.8 10*3/uL (ref 0.7–4.0)
MCHC: 34.1 g/dL (ref 30.0–36.0)
MCV: 86.7 fl (ref 78.0–100.0)
MONO ABS: 0.6 10*3/uL (ref 0.1–1.0)
MONOS PCT: 11.4 % (ref 3.0–12.0)
Neutro Abs: 3.8 10*3/uL (ref 1.4–7.7)
Neutrophils Relative %: 70 % (ref 43.0–77.0)
Platelets: 232 10*3/uL (ref 150.0–400.0)
RBC: 4.44 Mil/uL (ref 3.87–5.11)
RDW: 12.4 % (ref 11.5–15.5)
WBC: 5.4 10*3/uL (ref 4.0–10.5)

## 2017-12-22 LAB — COMPREHENSIVE METABOLIC PANEL
ALBUMIN: 4.2 g/dL (ref 3.5–5.2)
ALK PHOS: 83 U/L (ref 39–117)
ALT: 16 U/L (ref 0–35)
AST: 16 U/L (ref 0–37)
BILIRUBIN TOTAL: 0.6 mg/dL (ref 0.2–1.2)
BUN: 14 mg/dL (ref 6–23)
CO2: 29 mEq/L (ref 19–32)
CREATININE: 0.85 mg/dL (ref 0.40–1.20)
Calcium: 9.7 mg/dL (ref 8.4–10.5)
Chloride: 102 mEq/L (ref 96–112)
GFR: 71.97 mL/min (ref >60.00)
Glucose, Bld: 100 mg/dL — ABNORMAL HIGH (ref 70–99)
Potassium: 4.5 mEq/L (ref 3.5–5.1)
Sodium: 139 mEq/L (ref 135–145)
Total Protein: 6.7 g/dL (ref 6.0–8.3)

## 2017-12-22 LAB — LIPID PANEL
CHOLESTEROL: 186 mg/dL (ref 0–200)
HDL: 44.4 mg/dL (ref >39.00)
LDL Cholesterol: 121 mg/dL — ABNORMAL HIGH (ref 0–99)
NonHDL: 141.87
Total CHOL/HDL Ratio: 4
Triglycerides: 103 mg/dL (ref 0.0–149.0)
VLDL: 20.6 mg/dL (ref 0.0–40.0)

## 2017-12-22 MED ORDER — TRAMADOL HCL 50 MG PO TABS: 50.0000 mg | ORAL_TABLET | Freq: Three times a day (TID) | ORAL | 0 refills | Status: DC | PRN

## 2017-12-22 MED ORDER — DICLOFENAC SODIUM 75 MG PO TBEC: 75.0000 mg | DELAYED_RELEASE_TABLET | Freq: Two times a day (BID) | ORAL | 0 refills | Status: DC

## 2017-12-22 NOTE — Progress Notes (Signed)
Subjective  Chief Complaint  Patient presents with  . Annual Exam    Left side of ribs hurt, also currently has a cold    HPI: Illene SilverJane E Martinez is a 62 y.o. female who presents to [facility] today for a Female Wellness Visit.   Wellness Visit: annual visit with health maintenance review and exam w/o Pap because patient declines.  Last Pap smear was normal but greater than 5 years ago.  Low risk patient.  However she no longer would like cervical cancer screening.  She understands risks versus benefits.   Health maintenance: Normal mammogram.  Immunizations up-to-date.  Lifestyle: Remains active.  Fair diet.  Was at the beach last week and had a fall onto her left side while playing with her godson.  Noted pain when bending over a chair after the fall.  Since certain movements cause significant pain as does coughing or sneezing.  She has a mild URI.  No shortness of breath or pleuritic chest pain.  Right sided foot pain: See last visit.  Persists even with arch supports and shoes.  Limiting activities due to pain.  Significant pain after playing golf recently.  Ready for further evaluation  Mixed urinary incontinence: No relief with low-dose Vesicare.  Defers further evaluation at this time.  Symptoms are mainly stress incontinence related  Assessment  1. Annual physical exam   2. Pain of midfoot, right   3. Fallen arches   4. Left-sided chest wall pain   5. Mixed stress and urge urinary incontinence      Plan  Female Wellness Visit:  Age appropriate Health Maintenance and Prevention measures were discussed with patient. Included topics are cancer screening recommendations, ways to keep healthy (see AVS) including dietary and exercise recommendations, regular eye and dental care, use of seat belts, and avoidance of moderate alcohol use and tobacco use.  Declines Pap smear  BMI: discussed patient's BMI and encouraged positive lifestyle modifications to  help get to or maintain a target BMI.  HM needs and immunizations were addressed and ordered. See below for orders. See HM and immunization section for updates.  Routine labs and screening tests ordered including cmp, cbc and lipids where appropriate.  Discussed recommendations regarding Vit D and calcium supplementation (see AVS)  Chest wall pain: Slipped rib syndrome versus bruised rib versus fracture: Check chest x-ray.  Pain medicines and anti-inflammatories as needed.  Education given.  Foot pain: Check x-ray to rule out bony pathology.  If negative, refer to sports medicine for management.  Anti-inflammatories as needed.  Follow up: Return in about 1 year (around 12/23/2018) for complete physical.   Orders Placed This Encounter  Procedures  . DG Ribs Unilateral Left  . DG Foot Complete Right  . CBC with Differential/Platelet  . Comprehensive metabolic panel  . Lipid panel  . HIV antibody   Meds ordered this encounter  Medications  . diclofenac (VOLTAREN) 75 MG EC tablet    Sig: Take 1 tablet (75 mg total) by mouth 2 (two) times daily.    Dispense:  30 tablet    Refill:  0  . traMADol (ULTRAM) 50 MG tablet    Sig: Take 1 tablet (50 mg total) by mouth every 8 (eight) hours as needed.    Dispense:  30 tablet    Refill:  0     Lifestyle: Body mass index is 38.87 kg/m. Wt Readings from Last 3 Encounters:  12/22/17 263 lb 3.2 oz (119.4 kg)  09/28/17 264 lb 3.2 oz (119.8 kg)  09/15/17 262 lb (118.8 kg)   Diet: general Exercise: frequently,   Patient Active Problem List   Diagnosis Date Noted  . 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 Benign colon polyp 03/17/2016  . Family history of colon cancer 08/22/2014  . Family history of premature CAD 08/22/2014   Health Maintenance  Topic Date Due  . HIV Screening  09/22/1970  . INFLUENZA VACCINE  03/12/2018 (Originally 02/09/2018)  . MAMMOGRAM  11/12/2018  . TETANUS/TDAP  11/25/2024  . COLONOSCOPY   11/23/2025  . Hepatitis C Screening  Completed  . PAP SMEAR  Discontinued   Immunization History  Administered Date(s) Administered  . Tdap 11/26/2014  . Zoster 03/17/2016   We updated and reviewed the patient's past history in detail and it is documented below. Allergies: Patient has No Known Allergies. Past Medical History Patient  has a past medical history of History of chickenpox. Past Surgical History Patient  has a past surgical history that includes Replacement total knee bilateral; Abdominal hysterectomy; and Carpal tunnel release (Left). Family History: Patient family history includes Cancer in her brother, mother, and sister; Lung disease in her father. Social History:  Patient  reports that she has quit smoking. Her smoking use included cigars. She has never used smokeless tobacco. She reports that she drinks alcohol. She reports that she does not use drugs.  Review of Systems: Constitutional: negative for fever or malaise Ophthalmic: negative for photophobia, double vision or loss of vision Cardiovascular: negative for chest pain, dyspnea on exertion, or new LE swelling Respiratory: negative for SOB or persistent cough Gastrointestinal: negative for abdominal pain, change in bowel habits or melena Genitourinary: negative for dysuria or gross hematuria, no abnormal uterine bleeding or disharge Musculoskeletal: negative for new gait disturbance or muscular weakness Integumentary: negative for new or persistent rashes, no breast lumps Neurological: negative for TIA or stroke symptoms Psychiatric: negative for SI or delusions Allergic/Immunologic: negative for hives  Patient Care Team    Relationship Specialty Notifications Start End  Willow Ora, MD PCP - General Family Medicine  09/15/17   Ditty, Loura Halt, MD Consulting Physician Neurosurgery  09/15/17     Objective  Vitals: BP 116/84   Pulse 74   Temp 98 F (36.7 C) (Oral)   Resp 15   Ht 5\' 9"  (1.753 m)    Wt 263 lb 3.2 oz (119.4 kg)   SpO2 94%   BMI 38.87 kg/m  General:  Well developed, well nourished, no acute distress  Psych:  Alert and orientedx3,normal mood and affect HEENT:  Normocephalic, atraumatic, non-icteric sclera, PERRL, oropharynx is clear without mass or exudate, supple neck without adenopathy, mass or thyromegaly Cardiovascular:  Normal S1, S2, RRR without gallop, rub or murmur, nondisplaced PMI Respiratory:  Good breath sounds bilaterally, CTAB with normal respiratory effort Gastrointestinal: normal bowel sounds, soft, non-tender, no noted masses. No HSM MSK: no deformities, contusions. Joints are without erythema or swelling. Spine and CVA region are nontender, tenderness lateral left lower rib and chest wall without ecchymosis or crepitus. Skin:  Warm, no rashes or suspicious lesions noted Neurologic:    Mental status is normal. CN 2-11 are normal. Gross motor and sensory exams are normal. Normal gait. No tremor Breast Exam: No mass, skin retraction or nipple discharge is appreciated in either breast. No axillary adenopathy. Fibrocystic changes are not noted   Commons side effects, risks, benefits, and alternatives for medications and treatment plan prescribed today  were discussed, and the patient expressed understanding of the given instructions. Patient is instructed to call or message via MyChart if he/she has any questions or concerns regarding our treatment plan. No barriers to understanding were identified. We discussed Red Flag symptoms and signs in detail. Patient expressed understanding regarding what to do in case of urgent or emergency type symptoms.   Medication list was reconciled, printed and provided to the patient in AVS. Patient instructions and summary information was reviewed with the patient as documented in the AVS. This note was prepared with assistance of Dragon voice recognition software. Occasional wrong-word or sound-a-like substitutions may have  occurred due to the inherent limitations of voice recognition software

## 2017-12-22 NOTE — Patient Instructions (Addendum)
Please return in 12 months for your annual complete physical; please come fasting. You may consider a repeat steroid injection visit for your thumb as well.  You may use the antiinflammatory diclofenac twice a day for your chest wall pain. You may use the tramadol at night for pain if needed .  Please go to our [facility] [place] office to get your xrays done. You can walk in M-F between 8am and 5pm. Tell them you are there for xrays ordered by me. They will send me the results, then I will let you know the results with instructions.   Address: [address], [place] [phone]  (office sits [address]; from here, turn left onto [address] take to [address], turn right and go for a mile or so, office will be on left across form [address] )   If you have any questions or concerns, please don't hesitate to send me a message via MyChart or call the office at [phone]. Thank you for visiting with us today! It's our pleasure caring for you.   Please do these things to maintain good health!   Exercise at least 30-45 minutes a day,  4-5 days a week.   Eat a low-fat diet with lots of fruits and vegetables, up to 7-9 servings per day.  Drink plenty of water daily. Try to drink 8 8oz glasses per day.  Seatbelts can save your life. Always wear your seatbelt.  Place Smoke Detectors on every level of your home and check batteries every year.  Schedule an appointment with an eye doctor for an eye exam every 1-2 years  Safe sex - use condoms to protect yourself from STDs if you could be exposed to these types of infections. Use birth control if you do not want to become pregnant and are sexually active.  Avoid heavy alcohol use. If you drink, keep it to less than 2 drinks/day and not every day.  Health Care Power of Attorney.  Choose someone you trust that could speak for you if you became unable  to speak for yourself.  Depression is common in our stressful world.If you're feeling down or losing interest in things you normally enjoy, please come in for a visit.  If anyone is threatening or hurting you, please get help. Physical or Emotional Violence is never OK.    If you have any questions or concerns, please don't hesitate to send me a message via MyChart or call the office at [phone]. Thank you for visiting with us today! It's our pleasure caring for you.

## 2017-12-23 LAB — HIV ANTIBODY (ROUTINE TESTING W REFLEX): HIV 1&2 Ab, 4th Generation: NONREACTIVE

## 2017-12-25 NOTE — Progress Notes (Signed)
Please call patient: I have reviewed his/her lab results. Everything looks good! Cholesterol and blood work is all normal. Xrays results show normal ribs and bones in feet: I have placed a referral to sports Medicine for further evaluation and treatment of her foot pain. She should be hearing about an appointment soon.   The 10-year ASCVD risk score Denman George(Goff DC Montez HagemanJr., et al., 2013) is: 3.6%   Values used to calculate the score:     Age: [age] years     Sex: Female     Is Non-Hispanic African American: No     Diabetic: No     Tobacco smoker: No     Systolic Blood Pressure: 116 mmHg     Is BP treated: No     HDL Cholesterol: 44.4 mg/dL     Total Cholesterol: 186 mg/dL

## 2017-12-30 ENCOUNTER — Ambulatory Visit: Payer: BC Managed Care – PPO | Admitting: Sports Medicine

## 2017-12-30 ENCOUNTER — Encounter: Payer: Self-pay | Admitting: Sports Medicine

## 2017-12-30 VITALS — BP 128/78 | HR 91 | Ht 69.0 in | Wt 265.6 lb

## 2017-12-30 DIAGNOSIS — M2141 Flat foot [pes planus] (acquired), right foot: Secondary | ICD-10-CM

## 2017-12-30 DIAGNOSIS — M79671 Pain in right foot: Secondary | ICD-10-CM

## 2017-12-30 DIAGNOSIS — M7742 Metatarsalgia, left foot: Secondary | ICD-10-CM

## 2017-12-30 DIAGNOSIS — M7741 Metatarsalgia, right foot: Secondary | ICD-10-CM

## 2017-12-30 DIAGNOSIS — M2142 Flat foot [pes planus] (acquired), left foot: Secondary | ICD-10-CM

## 2017-12-30 DIAGNOSIS — R269 Unspecified abnormalities of gait and mobility: Secondary | ICD-10-CM

## 2017-12-30 NOTE — Progress Notes (Signed)
Veverly FellsMichael D. Delorise Shinerigby, DO  [facility] [phone]  Meghan Martinez - 62 y.o. female MRN [id number]  Date of birth: 1955/09/19  Visit Date: 12/30/2017  PCP: Willow OraAndy, Camille L, MD   Referred by: Willow OraAndy, Camille L, MD  Scribe(s) for today's visit: Christoper FabianMolly Weber, LAT, ATC  SUBJECTIVE:  Meghan Martinez is here for New Patient (Initial Visit) (R midfoot pain) .  Referred by: Dr. Asencion PartridgeAndy Camille  Her R midfoot (medial foot) symptoms INITIALLY: Began a few months ago w/ no MOI Described as mild-mod aching and sharp pain, radiating to R medial ankle. Worsened with golfing Improved with anti-inflammatories Additional associated symptoms include: no N/T noted in the R foot and no swelling    At this time symptoms show no change compared to onset  She has been taking Tramadol and Voltaren.  She has OTC arch supports.  She had an XR of her R foot on 12/22/17.   REVIEW OF SYSTEMS: Denies night time disturbances. Denies fevers, chills, or night sweats. Denies unexplained weight loss. Denies personal history of cancer. Denies changes in bowel or bladder habits. Reports recent unreported falls.  [place]. Reports new or worsening dyspnea or wheezing.  Yes to wheezing due to having a cold. Denies headaches or dizziness.  Denies numbness, tingling or weakness  In the extremities.  Denies dizziness or presyncopal episodes Denies lower extremity edema    HISTORY & PERTINENT PRIOR DATA:  Significant/pertinent history, findings, studies include:  reports that she has quit smoking. Her smoking use included cigars. She has never used smokeless tobacco. No results for input(s): HGBA1C, LABURIC, CREATINE in the last 8760 hours. No specialty comments available. No problems updated.  Otherwise prior history reviewed and updated per electronic medical record.    OBJECTIVE:  VS:  HT:5\' 9"  (175.3 cm)   WT:265 lb 9.6 oz (120.5 kg)   BMI:39.2    BP:128/78  HR:91bpm  TEMP: ( )  RESP:96 %   PHYSICAL EXAM: CONSTITUTIONAL: Well-developed, Well-nourished and In no acute distress Alert & appropriately interactive. and Not depressed or anxious appearing. RESPIRATORY: No increased work of breathing and Trachea Midline EYES: Pupils are equal., EOM intact without nystagmus. and No scleral icterus.  Lower extremities: Warm and well perfused Pulses: DP Pulses: Bilaterally normal and symmetric PT Pulses: Bilaterally normal and symmetric Edema: No Pre-tibial edema and No significant swelling or edema NEURO: unremarkable  MSK Exam: Bilateral Foot & Ankle Exam: No significant rashes/lesions/ulcerations overlying the examined area No overlying erythema/ecchymosis.  normal nails without lesions General Alignment: Moderate bossing of the midfoot.  Otherwise normal alignment Hiers Arch: High, rigid Hind Foot Alignment: Normal Posterior Tibialis Recruitment: normal Transverse Arch: abnormal:   Splay Toe present: Yes  Hammer Toes present: No  Bunion present: Early Bunionette present: No  Morton's Callus present: Yes  Palpation:   Navicular: nontender Base of the 5th metatarsal: nontender Posterior aspect of the MEDIAL Malleolus: nontender Posterior aspect of the LATERAL Malleolus: nontender TTP over the midfoot.  Generalized osteophytic bossing.  Poor great toe motion.  ROM: Normal, no significant limitations Strength: No significant weakness with Inversion, eversion, dorsiflexion and plantar flexion Ankle Stablity: stable to testing and No significant pain with midfoot abduction    PROCEDURES & DATA REVIEWED:  . None  ASSESSMENT  No diagnosis found.  PLAN:   Metatarsal pads added per AVS.    . Consider custom insoles.  Information provided per  AVS. . Related to longitudinal and transverse arch breakdown.  Will benefit from support provided today as well as custom cushion insoles if any lack of  improvement. .  No problem-specific Assessment & Plan notes found for this encounter.  Follow-up: Return if symptoms worsen or fail to improve.      Please see additional documentation for Objective, Assessment and Plan sections. Pertinent additional documentation may be included in corresponding procedure notes, imaging studies, problem based documentation and patient instructions. Please see these sections of the encounter for additional information regarding this visit.  CMA/ATC served as Neurosurgeon during this visit. History, Physical, and Plan performed by medical provider. Documentation and orders reviewed and attested to.      Andrena Mews, DO    [facility] Physician

## 2017-12-30 NOTE — Patient Instructions (Addendum)
If you need more of the pads for your shoes, you can go to [email or web address] to order more.  The specific pad you are looking for is called the Scaphoid Pad, size medium.   Look into having your insurance company cover a set of custom orthotics.  The code is L3030 and there are 2 units.  You can call them  and ask if this is covered.  I am happy to do these for you at any time, you just need to let our front office schedulers know you would like an "orthotic appointment."  Please also make sure you bring athletic shoes with you on the day of your orthotic appointment or whatever shoes you plan to wear your orthotics in most frequently.

## 2018-03-05 ENCOUNTER — Encounter: Payer: Self-pay | Admitting: Sports Medicine

## 2018-05-31 ENCOUNTER — Encounter: Payer: Self-pay | Admitting: Family Medicine

## 2018-05-31 ENCOUNTER — Ambulatory Visit: Payer: BC Managed Care – PPO | Admitting: Family Medicine

## 2018-05-31 VITALS — BP 110/76 | HR 79 | Temp 97.5°F | Wt 248.4 lb

## 2018-05-31 DIAGNOSIS — M5432 Sciatica, left side: Secondary | ICD-10-CM

## 2018-05-31 MED ORDER — PREDNISONE 10 MG PO TABS: ORAL_TABLET | ORAL | 0 refills | Status: DC

## 2018-05-31 MED ORDER — CYCLOBENZAPRINE HCL 10 MG PO TABS: 10.0000 mg | ORAL_TABLET | Freq: Three times a day (TID) | ORAL | 0 refills | Status: DC | PRN

## 2018-05-31 MED ORDER — TRAMADOL HCL 50 MG PO TABS: 50.0000 mg | ORAL_TABLET | Freq: Four times a day (QID) | ORAL | 0 refills | Status: DC | PRN

## 2018-05-31 NOTE — Progress Notes (Signed)
Subjective  CC:  Chief Complaint  Patient presents with  . Back Pain    pain runs down left leg     HPI: Meghan Martinez is a 62 y.o. female who presents to the office today to address the problems listed above in the chief complaint.  62 year old with 10 days of left-sided buttock pain that radiates down to the knee.  Has been mild but now progressive.  Limiting ambulation and ability to work.  Pain with certain movements.  Not yet interfering with sleep.  No bowel or bladder incontinence.  No injury or recent known stressors.  Has had mild symptoms of sciatica in the past but never this bad.  Has been using Naprosyn with mild relief of symptoms.  Also use over-the-counter TENS unit. Assessment  1. Left sided sciatica      Plan   Sciatica: Education and counseling given.  Start stretching exercises, prednisone taper, muscle relaxer and tramadol if needed.  Discussed red flags.  Follow-up in 1 to 2 weeks if not improving.  Patient declined flu vaccination  Follow up: As needed No orders of the defined types were placed in this encounter.  Meds ordered this encounter  Medications  . cyclobenzaprine (FLEXERIL) 10 MG tablet    Sig: Take 1 tablet (10 mg total) by mouth 3 (three) times daily as needed for muscle spasms.    Dispense:  30 tablet    Refill:  0  . predniSONE (DELTASONE) 10 MG tablet    Sig: Take 4 tabs qd x 2 days, 3 qd x 2 days, 2 qd x 2d, 1qd x 3 days    Dispense:  21 tablet    Refill:  0  . traMADol (ULTRAM) 50 MG tablet    Sig: Take 1 tablet (50 mg total) by mouth every 6 (six) hours as needed for moderate pain.    Dispense:  20 tablet    Refill:  0      I reviewed the patients updated PMH, FH, and SocHx.    Patient Active Problem List   Diagnosis Date Noted  . 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 Benign colon polyp 03/17/2016  . Family history of colon cancer 08/22/2014  . Family history of premature CAD 08/22/2014   No outpatient  medications have been marked as taking for the 05/31/18 encounter (Office Visit) with Willow Ora, MD.    Allergies: Patient has No Known Allergies. Family History: Patient family history includes Cancer in her brother, mother, and sister; Lung disease in her father. Social History:  Patient  reports that she has quit smoking. Her smoking use included cigars. She has never used smokeless tobacco. She reports that she drinks alcohol. She reports that she does not use drugs.  Review of Systems: Constitutional: Negative for fever malaise or anorexia Cardiovascular: negative for chest pain Respiratory: negative for SOB or persistent cough Gastrointestinal: negative for abdominal pain  Objective  Vitals: BP 110/76   Pulse 79   Temp (!) 97.5 F (36.4 C)   Wt 248 lb 6.4 oz (112.7 kg)   SpO2 97%   BMI 36.68 kg/m  General: Appears mildly uncomfortable, ambulating slowly, getting to exam table unassisted, A&Ox3 Back: No SI joint tenderness, left sciatic notch tenderness left paravertebral lumbar spasm present, normal flexion but painful extension which is limited.  Negative straight leg raise bilaterally normal quad strength bilaterally   Commons side effects, risks, benefits, and alternatives for medications and treatment plan prescribed  today were discussed, and the patient expressed understanding of the given instructions. Patient is instructed to call or message via MyChart if he/she has any questions or concerns regarding our treatment plan. No barriers to understanding were identified. We discussed Red Flag symptoms and signs in detail. Patient expressed understanding regarding what to do in case of urgent or emergency type symptoms.   Medication list was reconciled, printed and provided to the patient in AVS. Patient instructions and summary information was reviewed with the patient as documented in the AVS. This note was prepared with assistance of Dragon voice recognition software.  Occasional wrong-word or sound-a-like substitutions may have occurred due to the inherent limitations of voice recognition software

## 2018-05-31 NOTE — Patient Instructions (Signed)
Please follow up if symptoms do not improve or as needed.   Sciatica Sciatica is pain, numbness, weakness, or tingling along the path of the sciatic nerve. The sciatic nerve starts in the lower back and runs down the back of each leg. The nerve controls the muscles in the lower leg and in the back of the knee. It also provides feeling (sensation) to the back of the thigh, the lower leg, and the sole of the foot. Sciatica is a symptom of another medical condition that pinches or puts pressure on the sciatic nerve. Generally, sciatica only affects one side of the body. Sciatica usually goes away on its own or with treatment. In some cases, sciatica may keep coming back (recur). What are the causes? This condition is caused by pressure on the sciatic nerve, or pinching of the sciatic nerve. This may be the result of:  A disk in between the bones of the spine (vertebrae) bulging out too far (herniated disk).  Age-related changes in the spinal disks (degenerative disk disease).  A pain disorder that affects a muscle in the buttock (piriformis syndrome).  Extra bone growth (bone spur) near the sciatic nerve.  An injury or break (fracture) of the pelvis.  Pregnancy.  Tumor (rare).  What increases the risk? The following factors may make you more likely to develop this condition:  Playing sports that place pressure or stress on the spine, such as football or weight lifting.  Having poor strength and flexibility.  A history of back injury.  A history of back surgery.  Sitting for Lento periods of time.  Doing activities that involve repetitive bending or lifting.  Obesity.  What are the signs or symptoms? Symptoms can vary from mild to very severe, and they may include:  Any of these problems in the lower back, leg, hip, or buttock: ? Mild tingling or dull aches. ? Burning sensations. ? Sharp pains.  Numbness in the back of the calf or the sole of the foot.  Leg  weakness.  Severe back pain that makes movement difficult.  These symptoms may get worse when you cough, sneeze, or laugh, or when you sit or stand for Hollis periods of time. Being overweight may also make symptoms worse. In some cases, symptoms may recur over time. How is this diagnosed? This condition may be diagnosed based on:  Your symptoms.  A physical exam. Your health care provider may ask you to do certain movements to check whether those movements trigger your symptoms.  You may have tests, including: ? Blood tests. ? X-rays. ? MRI. ? CT scan.  How is this treated? In many cases, this condition improves on its own, without any treatment. However, treatment may include:  Reducing or modifying physical activity during periods of pain.  Exercising and stretching to strengthen your abdomen and improve the flexibility of your spine.  Icing and applying heat to the affected area.  Medicines that help: ? To relieve pain and swelling. ? To relax your muscles.  Injections of medicines that help to relieve pain, irritation, and inflammation around the sciatic nerve (steroids).  Surgery.  Follow these instructions at home: Medicines  Take over-the-counter and prescription medicines only as told by your health care provider.  Do not drive or operate heavy machinery while taking prescription pain medicine. Managing pain  If directed, apply ice to the affected area. ? Put ice in a plastic bag. ? Place a towel between your skin and the bag. ? Leave the  ice on for 20 minutes, 2-3 times a day.  After icing, apply heat to the affected area before you exercise or as often as told by your health care provider. Use the heat source that your health care provider recommends, such as a moist heat pack or a heating pad. ? Place a towel between your skin and the heat source. ? Leave the heat on for 20-30 minutes. ? Remove the heat if your skin turns bright red. This is especially  important if you are unable to feel pain, heat, or cold. You may have a greater risk of getting burned. Activity  Return to your normal activities as told by your health care provider. Ask your health care provider what activities are safe for you. ? Avoid activities that make your symptoms worse.  Take brief periods of rest throughout the day. Resting in a lying or standing position is usually better than sitting to rest. ? When you rest for longer periods, mix in some mild activity or stretching between periods of rest. This will help to prevent stiffness and pain. ? Avoid sitting for Toor periods of time without moving. Get up and move around at least one time each hour.  Exercise and stretch regularly, as told by your health care provider.  Do not lift anything that is heavier than 10 lb (4.5 kg) while you have symptoms of sciatica. When you do not have symptoms, you should still avoid heavy lifting, especially repetitive heavy lifting.  When you lift objects, always use proper lifting technique, which includes: ? Bending your knees. ? Keeping the load close to your body. ? Avoiding twisting. General instructions  Use good posture. ? Avoid leaning forward while sitting. ? Avoid hunching over while standing.  Maintain a healthy weight. Excess weight puts extra stress on your back and makes it difficult to maintain good posture.  Wear supportive, comfortable shoes. Avoid wearing high heels.  Avoid sleeping on a mattress that is too soft or too hard. A mattress that is firm enough to support your back when you sleep may help to reduce your pain.  Keep all follow-up visits as told by your health care provider. This is important. Contact a health care provider if:  You have pain that wakes you up when you are sleeping.  You have pain that gets worse when you lie down.  Your pain is worse than you have experienced in the past.  Your pain lasts longer than 4 weeks.  You  experience unexplained weight loss. Get help right away if:  You lose control of your bowel or bladder (incontinence).  You have: ? Weakness in your lower back, pelvis, buttocks, or legs that gets worse. ? Redness or swelling of your back. ? A burning sensation when you urinate. This information is not intended to replace advice given to you by your health care provider. Make sure you discuss any questions you have with your health care provider. Document Released: 06/22/2001 Document Revised: 12/02/2015 Document Reviewed: 03/07/2015 Elsevier Interactive Patient Education  [employer].

## 2018-11-13 ENCOUNTER — Other Ambulatory Visit: Payer: Self-pay | Admitting: Family Medicine

## 2018-11-13 DIAGNOSIS — Z1231 Encounter for screening mammogram for malignant neoplasm of breast: Secondary | ICD-10-CM

## 2018-12-25 ENCOUNTER — Other Ambulatory Visit: Payer: Self-pay

## 2018-12-25 ENCOUNTER — Ambulatory Visit (INDEPENDENT_AMBULATORY_CARE_PROVIDER_SITE_OTHER): Payer: BC Managed Care – PPO | Admitting: Family Medicine

## 2018-12-25 ENCOUNTER — Encounter: Payer: Self-pay | Admitting: Family Medicine

## 2018-12-25 VITALS — BP 118/74 | HR 67 | Temp 98.1°F | Resp 16 | Ht 69.0 in | Wt 238.8 lb

## 2018-12-25 DIAGNOSIS — Z23 Encounter for immunization: Secondary | ICD-10-CM

## 2018-12-25 DIAGNOSIS — Z0000 Encounter for general adult medical examination without abnormal findings: Secondary | ICD-10-CM

## 2018-12-25 DIAGNOSIS — Z800 Family history of malignant neoplasm of digestive organs: Secondary | ICD-10-CM | POA: Diagnosis not present

## 2018-12-25 DIAGNOSIS — Z8249 Family history of ischemic heart disease and other diseases of the circulatory system: Secondary | ICD-10-CM | POA: Diagnosis not present

## 2018-12-25 DIAGNOSIS — Z5320 Procedure and treatment not carried out because of patient's decision for unspecified reasons: Secondary | ICD-10-CM | POA: Insufficient documentation

## 2018-12-25 LAB — CBC WITH DIFFERENTIAL/PLATELET
Basophils Absolute: 0 10*3/uL (ref 0.0–0.1)
Basophils Relative: 0.8 % (ref 0.0–3.0)
Eosinophils Absolute: 0.1 10*3/uL (ref 0.0–0.7)
Eosinophils Relative: 2.1 % (ref 0.0–5.0)
HCT: 39.5 % (ref 36.0–46.0)
Hemoglobin: 13.3 g/dL (ref 12.0–15.0)
Lymphocytes Relative: 31.6 % (ref 12.0–46.0)
Lymphs Abs: 1.6 10*3/uL (ref 0.7–4.0)
MCHC: 33.8 g/dL (ref 30.0–36.0)
MCV: 88 fl (ref 78.0–100.0)
Monocytes Absolute: 0.5 10*3/uL (ref 0.1–1.0)
Monocytes Relative: 10.4 % (ref 3.0–12.0)
Neutro Abs: 2.8 10*3/uL (ref 1.4–7.7)
Neutrophils Relative %: 55.1 % (ref 43.0–77.0)
Platelets: 263 10*3/uL (ref 150.0–400.0)
RBC: 4.49 Mil/uL (ref 3.87–5.11)
RDW: 12.7 % (ref 11.5–15.5)
WBC: 5.1 10*3/uL (ref 4.0–10.5)

## 2018-12-25 LAB — LIPID PANEL
Cholesterol: 178 mg/dL (ref 0–200)
HDL: 41.1 mg/dL (ref >39.00)
LDL Cholesterol: 110 mg/dL — ABNORMAL HIGH (ref 0–99)
NonHDL: 137.17
Total CHOL/HDL Ratio: 4
Triglycerides: 135 mg/dL (ref 0.0–149.0)
VLDL: 27 mg/dL (ref 0.0–40.0)

## 2018-12-25 LAB — COMPREHENSIVE METABOLIC PANEL
ALT: 14 U/L (ref 0–35)
AST: 14 U/L (ref 0–37)
Albumin: 4.1 g/dL (ref 3.5–5.2)
Alkaline Phosphatase: 90 U/L (ref 39–117)
BUN: 17 mg/dL (ref 6–23)
CO2: 29 mEq/L (ref 19–32)
Calcium: 9.5 mg/dL (ref 8.4–10.5)
Chloride: 103 mEq/L (ref 96–112)
Creatinine, Ser: 0.87 mg/dL (ref 0.40–1.20)
GFR: 65.71 mL/min (ref >60.00)
Glucose, Bld: 93 mg/dL (ref 70–99)
Potassium: 4.6 mEq/L (ref 3.5–5.1)
Sodium: 139 mEq/L (ref 135–145)
Total Bilirubin: 0.5 mg/dL (ref 0.2–1.2)
Total Protein: 6.6 g/dL (ref 6.0–8.3)

## 2018-12-25 MED ORDER — SHINGRIX 50 MCG/0.5ML IM SUSR: 0.5000 mL | Freq: Once | INTRAMUSCULAR | 0 refills | Status: DC

## 2018-12-25 NOTE — Patient Instructions (Addendum)
Please return in 12 months for your annual complete physical; please come fasting.  I will release your lab results to you on your MyChart account with further instructions. Please reply with any questions.  Sign up now!  Today you were given your Shingrix 1 of 2 vaccination.   If you have any questions or concerns, please don't hesitate to send me a message via MyChart or call the office at [phone]. Thank you for visiting with [place] today! It's our pleasure caring for you.   Preventive Care 40-64 Years, Female Preventive care refers to lifestyle choices and visits with your health care provider that can promote health and wellness. What does preventive care include?   A yearly physical exam. This is also called an annual well check.  Dental exams once or twice a year.  Routine eye exams. Ask your health care provider how often you should have your eyes checked.  Personal lifestyle choices, including: ? Daily care of your teeth and gums. ? Regular physical activity. ? Eating a healthy diet. ? Avoiding tobacco and drug use. ? Limiting alcohol use. ? Practicing safe sex. ? Taking low-dose aspirin daily starting at age 73. ? Taking vitamin and mineral supplements as recommended by your health care provider. What happens during an annual well check? The services and screenings done by your health care provider during your annual well check will depend on your age, overall health, lifestyle risk factors, and family history of disease. Counseling Your health care provider may ask you questions about your:  Alcohol use.  Tobacco use.  Drug use.  Emotional well-being.  Home and relationship well-being.  Sexual activity.  Eating habits.  Work and work Statistician.  Method of birth control.  Menstrual cycle.  Pregnancy history. Screening You may have the following tests or measurements:  Height, weight, and BMI.  Blood pressure.  Lipid and cholesterol levels.  These may be checked every 5 years, or more frequently if you are over 17 years old.  Skin check.  Lung cancer screening. You may have this screening every year starting at age 25 if you have a 30-pack-year history of smoking and currently smoke or have quit within the past 15 years.  Colorectal cancer screening. All adults should have this screening starting at age 58 and continuing until age 14. Your health care provider may recommend screening at age 12. You will have tests every 1-10 years, depending on your results and the type of screening test. People at increased risk should start screening at an earlier age. Screening tests may include: ? Guaiac-based fecal occult blood testing. ? Fecal immunochemical test (FIT). ? Stool DNA test. ? Virtual colonoscopy. ? Sigmoidoscopy. During this test, a flexible tube with a tiny camera (sigmoidoscope) is used to examine your rectum and lower colon. The sigmoidoscope is inserted through your anus into your rectum and lower colon. ? Colonoscopy. During this test, a [place], thin, flexible tube with a tiny camera (colonoscope) is used to examine your entire colon and rectum.  Hepatitis C blood test.  Hepatitis B blood test.  Sexually transmitted disease (STD) testing.  Diabetes screening. This is done by checking your blood sugar (glucose) after you have not eaten for a while (fasting). You may have this done every 1-3 years.  Mammogram. This may be done every 1-2 years. Talk to your health care provider about when you should start having regular mammograms. This may depend on whether you have a family history of breast cancer.  BRCA-related  cancer screening. This may be done if you have a family history of breast, ovarian, tubal, or peritoneal cancers.  Pelvic exam and Pap test. This may be done every 3 years starting at age 59. Starting at age 71, this may be done every 5 years if you have a Pap test in combination with an HPV test.  Bone density  scan. This is done to screen for osteoporosis. You may have this scan if you are at high risk for osteoporosis. Discuss your test results, treatment options, and if necessary, the need for more tests with your health care provider. Vaccines Your health care provider may recommend certain vaccines, such as:  Influenza vaccine. This is recommended every year.  Tetanus, diphtheria, and acellular pertussis (Tdap, Td) vaccine. You may need a Td booster every 10 years.  Varicella vaccine. You may need this if you have not been vaccinated.  Zoster vaccine. You may need this after age 62.  Measles, mumps, and rubella (MMR) vaccine. You may need at least one dose of MMR if you were born in 1957 or later. You may also need a second dose.  Pneumococcal 13-valent conjugate (PCV13) vaccine. You may need this if you have certain conditions and were not previously vaccinated.  Pneumococcal polysaccharide (PPSV23) vaccine. You may need one or two doses if you smoke cigarettes or if you have certain conditions.  Meningococcal vaccine. You may need this if you have certain conditions.  Hepatitis A vaccine. You may need this if you have certain conditions or if you travel or work in places where you may be exposed to hepatitis A.  Hepatitis B vaccine. You may need this if you have certain conditions or if you travel or work in places where you may be exposed to hepatitis B.  Haemophilus influenzae type b (Hib) vaccine. You may need this if you have certain conditions. Talk to your health care provider about which screenings and vaccines you need and how often you need them. This information is not intended to replace advice given to you by your health care provider. Make sure you discuss any questions you have with your health care provider. Document Released: 07/25/2015 Document Revised: 08/18/2017 Document Reviewed: 04/29/2015 Elsevier Interactive Patient Education  2019 [employer].

## 2018-12-25 NOTE — Progress Notes (Signed)
Subjective  Chief Complaint  Patient presents with  . Annual Exam    Fasting    HPI: Illene SilverJane E Martinez is a 63 y.o. female who presents to [facility] [place] today for a Female Wellness Visit.   Wellness Visit: annual visit with health maintenance review and exam without Pap   Doing well. Out of work due to [employer]ov[employer] but coping well. No new concerns. Fasting for lab work. Discussed zostrix. Other imms up to date mammo is scheduled. Colonoscopy up to date. Has lost some weight.   Assessment  1. Annual physical exam   2. Family history of premature CAD   3. Family history of colon cancer      Plan  Female Wellness Visit:  Age appropriate Health Maintenance and Prevention measures were discussed with patient. Included topics are cancer screening recommendations, ways to keep healthy (see AVS) including dietary and exercise recommendations, regular eye and dental care, use of seat belts, and avoidance of moderate alcohol use and tobacco use.   BMI: discussed patient's BMI and encouraged positive lifestyle modifications to help get to or maintain a target BMI.  HM needs and immunizations were addressed and ordered. See below for orders. See HM and immunization section for updates.  Routine labs and screening tests ordered including cmp, cbc and lipids where appropriate.  Discussed recommendations regarding Vit D and calcium supplementation (see AVS)  Follow up: Return in about 1 year (around 12/25/2019) for complete physical, and 6 month nurse visit for 2nd shingrix vaccination.   Orders Placed This Encounter  Procedures  . CBC with Differential/Platelet  . Comprehensive metabolic panel  . Lipid panel   Meds ordered this encounter  Medications  . Zoster Vaccine Adjuvanted [facility](SHINGRIX) injection    Sig: Inject 0.5 mLs into the muscle once for 1 dose. Please give 2nd dose 2-6 months after first dose    Dispense:  2 each    Refill:  0     Lifestyle: Body mass index  is 35.26 kg/m. Wt Readings from Last 3 Encounters:  12/25/18 238 lb 12.8 oz (108.3 kg)  05/31/18 248 lb 6.4 oz (112.7 kg)  12/30/17 265 lb 9.6 oz (120.5 kg)   Diet: general Exercise: frequently,   Patient Active Problem List   Diagnosis Date Noted  . Papanicolaou smear declined 12/25/2018    Priority: Low  . 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 Benign colon polyp 03/17/2016  . Family history of colon cancer 08/22/2014  . Family history of premature CAD 08/22/2014   Health Maintenance  Topic Date Due  . MAMMOGRAM  11/12/2018  . INFLUENZA VACCINE  02/10/2019  . COLONOSCOPY  11/23/2020  . TETANUS/TDAP  11/25/2024  . Hepatitis C Screening  Completed  . HIV Screening  Completed  . PAP SMEAR-Modifier  Discontinued   Immunization History  Administered Date(s) Administered  . Tdap 11/26/2014  . Zoster 03/17/2016   We updated and reviewed the patient's past history in detail and it is documented below. Allergies: Patient has No Known Allergies. Past Medical History Patient  has a past medical history of History of chickenpox. Past Surgical History Patient  has a past surgical history that includes Replacement total knee bilateral; Abdominal hysterectomy; and Carpal tunnel release (Left). Family History: Patient family history includes Cancer in her brother, mother, and sister; Lung disease in her father. Social History:  Patient  reports that she has quit smoking. Her smoking use included cigars. She has never used  smokeless tobacco. She reports current alcohol use. She reports that she does not use drugs.  Review of Systems: Constitutional: negative for fever or malaise Ophthalmic: negative for photophobia, double vision or loss of vision Cardiovascular: negative for chest pain, dyspnea on exertion, or new LE swelling Respiratory: negative for SOB or persistent cough Gastrointestinal: negative for abdominal pain, change in bowel habits or melena  Genitourinary: negative for dysuria or gross hematuria, no abnormal uterine bleeding or disharge Musculoskeletal: negative for new gait disturbance or muscular weakness Integumentary: negative for new or persistent rashes, no breast lumps Neurological: negative for TIA or stroke symptoms Psychiatric: negative for SI or delusions Allergic/Immunologic: negative for hives  Patient Care Team    Relationship Specialty Notifications Start End  Leamon Arnt, MD PCP - General Family Medicine  09/15/17   Ditty, Kevan Ny, MD Consulting Physician Neurosurgery  09/15/17     Objective  Vitals: BP 118/74   Pulse 67   Temp 98.1 F (36.7 C) (Oral)   Resp 16   Ht 5\' 9"  (1.753 m)   Wt 238 lb 12.8 oz (108.3 kg)   SpO2 99%   BMI 35.26 kg/m  General:  Well developed, well nourished, no acute distress  Psych:  Alert and orientedx3,normal mood and affect HEENT:  Normocephalic, atraumatic, non-icteric sclera, PERRL, oropharynx is clear without mass or exudate, supple neck without adenopathy, mass or thyromegaly Cardiovascular:  Normal S1, S2, RRR without gallop, rub or murmur, nondisplaced PMI Respiratory:  Good breath sounds bilaterally, CTAB with normal respiratory effort Gastrointestinal: normal bowel sounds, soft, non-tender, no noted masses. No HSM MSK: no deformities, contusions. Joints are without erythema or swelling. Spine and CVA region are nontender Skin:  Warm, no rashes or suspicious lesions noted Neurologic:    Mental status is normal. CN 2-11 are normal. Gross motor and sensory exams are normal. Normal gait. No tremor Breast Exam: No mass, skin retraction or nipple discharge is appreciated in either breast. No axillary adenopathy. Fibrocystic changes are not noted   Commons side effects, risks, benefits, and alternatives for medications and treatment plan prescribed today were discussed, and the patient expressed understanding of the given instructions. Patient is instructed to call  or message via MyChart if he/she has any questions or concerns regarding our treatment plan. No barriers to understanding were identified. We discussed Red Flag symptoms and signs in detail. Patient expressed understanding regarding what to do in case of urgent or emergency type symptoms.   Medication list was reconciled, printed and provided to the patient in AVS. Patient instructions and summary information was reviewed with the patient as documented in the AVS. This note was prepared with assistance of Dragon voice recognition software. Occasional wrong-word or sound-a-like substitutions may have occurred due to the inherent limitations of voice recognition software

## 2019-01-05 ENCOUNTER — Other Ambulatory Visit: Payer: Self-pay

## 2019-01-05 ENCOUNTER — Ambulatory Visit
Admission: RE | Admit: 2019-01-05 | Discharge: 2019-01-05 | Disposition: A | Discharge status: Home / Self Care | Payer: BC Managed Care – PPO | Source: Ambulatory Visit | Attending: Family Medicine | Admitting: Family Medicine

## 2019-01-05 DIAGNOSIS — Z1231 Encounter for screening mammogram for malignant neoplasm of breast: Secondary | ICD-10-CM

## 2019-06-25 ENCOUNTER — Other Ambulatory Visit: Payer: Self-pay

## 2019-06-26 ENCOUNTER — Ambulatory Visit (INDEPENDENT_AMBULATORY_CARE_PROVIDER_SITE_OTHER): Payer: BC Managed Care – PPO

## 2019-06-26 DIAGNOSIS — Z23 Encounter for immunization: Secondary | ICD-10-CM

## 2019-06-26 NOTE — Progress Notes (Signed)
Per orders of Dr. Jonni Sanger, injection of Shingrix 0.5 ml given left deltoid IM  by Clearnce Sorrel Deyon Chizek, CMA  Patient tolerated injection well.

## 2019-09-16 ENCOUNTER — Ambulatory Visit: Payer: BC Managed Care – PPO | Attending: Internal Medicine

## 2019-09-16 DIAGNOSIS — Z23 Encounter for immunization: Secondary | ICD-10-CM | POA: Insufficient documentation

## 2019-09-16 NOTE — Progress Notes (Signed)
   Covid-19 Vaccination Clinic  Name:  Meghan Martinez    MRN: [id number] DOB: 1955-11-24  09/16/2019  [name] was observed post Covid-19 immunization for 15 minutes without incident. She was provided with Vaccine Information Sheet and instruction to access the V-Safe system.   [name] was instructed to call 911 with any severe reactions post vaccine: Marland Kitchen Difficulty breathing  . Swelling of face and throat  . A fast heartbeat  . A bad rash all over body  . Dizziness and weakness   Immunizations Administered    Name Date Dose VIS Date Route   Pfizer COVID-19 Vaccine 09/16/2019  5:34 PM 0.3 mL 06/22/2019 Intramuscular   Manufacturer: [employer], [employer]   Lot: [id number]   NDC: [address]-1

## 2019-10-16 ENCOUNTER — Ambulatory Visit: Payer: BC Managed Care – PPO | Attending: Internal Medicine

## 2019-10-16 DIAGNOSIS — Z23 Encounter for immunization: Secondary | ICD-10-CM

## 2019-10-16 NOTE — Progress Notes (Signed)
   Covid-19 Vaccination Clinic  Name:  CHANNELLE BOTTGER    MRN: [id number] DOB: [birth year]  10/16/2019  Ms. Gendreau was observed post Covid-19 immunization for 15 minutes without incident. She was provided with Vaccine Information Sheet and instruction to access the V-Safe system.   Ms. Disbrow was instructed to call 911 with any severe reactions post vaccine: Marland Kitchen Difficulty breathing  . Swelling of face and throat  . A fast heartbeat  . A bad rash all over body  . Dizziness and weakness   Immunizations Administered    Name Date Dose VIS Date Route   Pfizer COVID-19 Vaccine 10/16/2019  2:51 PM 0.3 mL 06/22/2019 Intramuscular   Manufacturer: [employer], [employer]   Lot: [id number]   NDC: [address]-4

## 2019-12-26 ENCOUNTER — Ambulatory Visit (INDEPENDENT_AMBULATORY_CARE_PROVIDER_SITE_OTHER): Payer: BC Managed Care – PPO | Admitting: Family Medicine

## 2019-12-26 ENCOUNTER — Other Ambulatory Visit: Payer: Self-pay

## 2019-12-26 ENCOUNTER — Encounter: Payer: Self-pay | Admitting: Family Medicine

## 2019-12-26 VITALS — BP 140/82 | HR 72 | Temp 97.6°F | Resp 16 | Ht 69.0 in | Wt 263.0 lb

## 2019-12-26 DIAGNOSIS — E669 Obesity, unspecified: Secondary | ICD-10-CM | POA: Diagnosis not present

## 2019-12-26 DIAGNOSIS — Z0000 Encounter for general adult medical examination without abnormal findings: Secondary | ICD-10-CM

## 2019-12-26 HISTORY — DX: Obesity, unspecified: E66.9

## 2019-12-26 LAB — CBC WITH DIFFERENTIAL/PLATELET
Basophils Absolute: 0.1 10*3/uL (ref 0.0–0.1)
Basophils Relative: 1 % (ref 0.0–3.0)
Eosinophils Absolute: 0.1 10*3/uL (ref 0.0–0.7)
Eosinophils Relative: 1.9 % (ref 0.0–5.0)
HCT: 39.1 % (ref 36.0–46.0)
Hemoglobin: 13.2 g/dL (ref 12.0–15.0)
Lymphocytes Relative: 30.5 % (ref 12.0–46.0)
Lymphs Abs: 1.6 10*3/uL (ref 0.7–4.0)
MCHC: 33.7 g/dL (ref 30.0–36.0)
MCV: 87.3 fl (ref 78.0–100.0)
Monocytes Absolute: 0.5 10*3/uL (ref 0.1–1.0)
Monocytes Relative: 9.8 % (ref 3.0–12.0)
Neutro Abs: 3 10*3/uL (ref 1.4–7.7)
Neutrophils Relative %: 56.8 % (ref 43.0–77.0)
Platelets: 248 10*3/uL (ref 150.0–400.0)
RBC: 4.48 Mil/uL (ref 3.87–5.11)
RDW: 13.3 % (ref 11.5–15.5)
WBC: 5.2 10*3/uL (ref 4.0–10.5)

## 2019-12-26 LAB — COMPREHENSIVE METABOLIC PANEL
ALT: 16 U/L (ref 0–35)
AST: 16 U/L (ref 0–37)
Albumin: 4.4 g/dL (ref 3.5–5.2)
Alkaline Phosphatase: 87 U/L (ref 39–117)
BUN: 18 mg/dL (ref 6–23)
CO2: 28 mEq/L (ref 19–32)
Calcium: 9.5 mg/dL (ref 8.4–10.5)
Chloride: 103 mEq/L (ref 96–112)
Creatinine, Ser: 0.83 mg/dL (ref 0.40–1.20)
GFR: 69.15 mL/min (ref >60.00)
Glucose, Bld: 93 mg/dL (ref 70–99)
Potassium: 4 mEq/L (ref 3.5–5.1)
Sodium: 138 mEq/L (ref 135–145)
Total Bilirubin: 0.7 mg/dL (ref 0.2–1.2)
Total Protein: 7 g/dL (ref 6.0–8.3)

## 2019-12-26 LAB — LIPID PANEL
Cholesterol: 192 mg/dL (ref 0–200)
HDL: 49 mg/dL (ref >39.00)
LDL Cholesterol: 126 mg/dL — ABNORMAL HIGH (ref 0–99)
NonHDL: 143.27
Total CHOL/HDL Ratio: 4
Triglycerides: 84 mg/dL (ref 0.0–149.0)
VLDL: 16.8 mg/dL (ref 0.0–40.0)

## 2019-12-26 NOTE — Progress Notes (Signed)
Subjective  Chief Complaint  Patient presents with  . Annual Exam    HPI: Meghan Martinez is a 64 y.o. female who presents to [facility] [place] today for a Female Wellness Visit.   Wellness Visit: annual visit with health maintenance review and exam without Pap s/p hysterectomy   HM: all screens are up to date - mammo due this month. Doing well. No new concerns. Has regained weight. Eating well. happy  Assessment  1. Annual physical exam   2. Obesity (BMI 30-39.9)      Plan  Female Wellness Visit:  Age appropriate Health Maintenance and Prevention measures were discussed with patient. Included topics are cancer screening recommendations, ways to keep healthy (see AVS) including dietary and exercise recommendations, regular eye and dental care, use of seat belts, and avoidance of moderate alcohol use and tobacco use. utd - mammo due and pt will schedule.   BMI: discussed patient's BMI and encouraged positive lifestyle modifications to help get to or maintain a target BMI. Discussed increasing veggies and non processed foods  HM needs and immunizations were addressed and ordered. See below for orders. See HM and immunization section for updates.  Routine labs and screening tests ordered including cmp, cbc and lipids where appropriate.  Discussed recommendations regarding Vit D and calcium supplementation (see AVS)  Follow up: 12 months for cpe   Orders Placed This Encounter  Procedures  . CBC with Differential/Platelet  . Comprehensive metabolic panel  . Lipid panel   No orders of the defined types were placed in this encounter.    Lifestyle: Body mass index is 38.84 kg/m. Wt Readings from Last 3 Encounters:  12/26/19 263 lb (119.3 kg)  12/25/18 238 lb 12.8 oz (108.3 kg)  05/31/18 248 lb 6.4 oz (112.7 kg)     Patient Active Problem List   Diagnosis Date Noted  . Obesity (BMI 30-39.9) 12/26/2019  . Mixed stress and urge urinary incontinence  09/15/2017  . Benign colon polyp 03/17/2016  . Family history of colon cancer 08/22/2014  . Family history of premature CAD 08/22/2014   Health Maintenance  Topic Date Due  . MAMMOGRAM  01/05/2020  . INFLUENZA VACCINE  02/10/2020  . COLONOSCOPY  11/23/2020  . TETANUS/TDAP  11/25/2024  . COVID-19 Vaccine  Completed  . Hepatitis C Screening  Completed  . HIV Screening  Completed  . PAP SMEAR-Modifier  Discontinued   Immunization History  Administered Date(s) Administered  . PFIZER SARS-COV-2 Vaccination 09/16/2019, 10/16/2019  . Tdap 11/26/2014  . Zoster 03/17/2016  . Zoster Recombinat (Shingrix) 12/25/2018, 06/26/2019   We updated and reviewed the patient's past history in detail and it is documented below. Allergies: Patient has No Known Allergies. Past Medical History Patient  has a past medical history of History of chickenpox and Obesity (BMI 30-39.9) (12/26/2019). Past Surgical History Patient  has a past surgical history that includes Replacement total knee bilateral; Abdominal hysterectomy; and Carpal tunnel release (Left). Family History: Patient family history includes Cancer in her brother, mother, and sister; Lung disease in her father. Social History:  Patient  reports that she has quit smoking. Her smoking use included cigars. She has never used smokeless tobacco. She reports current alcohol use. She reports that she does not use drugs.  Review of Systems: Constitutional: negative for fever or malaise Ophthalmic: negative for photophobia, double vision or loss of vision Cardiovascular: negative for chest pain, dyspnea on exertion, or new LE swelling Respiratory: negative for SOB  or persistent cough Gastrointestinal: negative for abdominal pain, change in bowel habits or melena Genitourinary: negative for dysuria or gross hematuria, no abnormal uterine bleeding or disharge Musculoskeletal: negative for new gait disturbance or muscular weakness Integumentary:  negative for new or persistent rashes, no breast lumps Neurological: negative for TIA or stroke symptoms Psychiatric: negative for SI or delusions Allergic/Immunologic: negative for hives  Patient Care Team    Relationship Specialty Notifications Start End  Willow Ora, MD PCP - General Family Medicine  09/15/17   Ditty, Loura Halt, MD Consulting Physician Neurosurgery  09/15/17     Objective  Vitals: BP 140/82   Pulse 72   Temp 97.6 F (36.4 C) (Temporal)   Resp 16   Ht 5\' 9"  (1.753 m)   Wt 263 lb (119.3 kg)   SpO2 97%   BMI 38.84 kg/m  General:  Well developed, well nourished, no acute distress  Psych:  Alert and orientedx3,normal mood and affect HEENT:  Normocephalic, atraumatic, non-icteric sclera, supple neck without adenopathy, mass or thyromegaly Cardiovascular:  Normal S1, S2, RRR without gallop, rub or murmur Respiratory:  Good breath sounds bilaterally, CTAB with normal respiratory effort Gastrointestinal: normal bowel sounds, soft, non-tender, no noted masses. No HSM MSK: no deformities, contusions. Joints are without erythema or swelling.  Skin:  Warm, no rashes or suspicious lesions noted Neurologic:    Mental status is normal. CN 2-11 are normal. Gross motor and sensory exams are normal. Normal gait. No tremor Breast Exam: No mass, skin retraction or nipple discharge is appreciated in either breast. No axillary adenopathy. Fibrocystic changes are not noted   Commons side effects, risks, benefits, and alternatives for medications and treatment plan prescribed today were discussed, and the patient expressed understanding of the given instructions. Patient is instructed to call or message via MyChart if he/she has any questions or concerns regarding our treatment plan. No barriers to understanding were identified. We discussed Red Flag symptoms and signs in detail. Patient expressed understanding regarding what to do in case of urgent or emergency type symptoms.    Medication list was reconciled, printed and provided to the patient in AVS. Patient instructions and summary information was reviewed with the patient as documented in the AVS. This note was prepared with assistance of Dragon voice recognition software. Occasional wrong-word or sound-a-like substitutions may have occurred due to the inherent limitations of voice recognition software  This visit occurred during the SARS-CoV-2 public health emergency.  Safety protocols were in place, including screening questions prior to the visit, additional usage of staff PPE, and extensive cleaning of exam room while observing appropriate contact time as indicated for disinfecting solutions.

## 2019-12-26 NOTE — Patient Instructions (Addendum)
Please return in 12 months for your annual complete physical; please come fasting.  I will release your lab results to you on your MyChart account with further instructions. Please reply with any questions.   If you have any questions or concerns, please don't hesitate to send me a message via MyChart or call the office at [phone]. Thank you for visiting with [place] today! It's our pleasure caring for you.  Please schedule your: [x]   Mammogram  []   Bone Density  Please call the office checked below to schedule your appointment: Your appointment will at the following location  [x]   [facility]      [place], [place]         []   [facility] [place], [place]   Fat and Cholesterol Restricted Eating Plan Getting too much fat and cholesterol in your diet may cause health problems. Choosing the right foods helps keep your fat and cholesterol at normal levels. This can keep you from getting certain diseases. Your doctor may recommend an eating plan that includes:  Total fat: ______% or less of total calories a day.  Saturated fat: ______% or less of total calories a day.  Cholesterol: less than _________mg a day.  Fiber: ______g a day. What are tips for following this plan? Meal planning  At meals, divide your plate into four equal parts: ? Fill one-half of your plate with vegetables and green salads. ? Fill one-fourth of your plate with whole grains. ? Fill one-fourth of your plate with low-fat (lean) protein foods.  Eat fish that is high in omega-3 fats at least two times a week. This includes mackerel, tuna, sardines, and salmon.  Eat foods that are high in fiber, such as whole grains, beans, apples, broccoli, carrots, peas, and barley. General tips   Work with your doctor to lose weight if you need to.  Avoid: ? Foods with added sugar. ? Fried  foods. ? Foods with partially hydrogenated oils.  Limit alcohol intake to no more than 1 drink a day for nonpregnant women and 2 drinks a day for men. One drink equals 12 oz of beer, 5 oz of wine, or 1 oz of hard liquor. Reading food labels  Check food labels for: ? Trans fats. ? Partially hydrogenated oils. ? Saturated fat (g) in each serving. ? Cholesterol (mg) in each serving. ? Fiber (g) in each serving.  Choose foods with healthy fats, such as: ? Monounsaturated fats. ? Polyunsaturated fats. ? Omega-3 fats.  Choose grain products that have whole grains. Look for the word "whole" as the first word in the ingredient list. Cooking  Cook foods using low-fat methods. These include baking, boiling, grilling, and broiling.  Eat more home-cooked foods. Eat at restaurants and buffets less often.  Avoid cooking using saturated fats, such as butter, cream, palm oil, palm kernel oil, and coconut oil. Recommended foods  Fruits  All fresh, canned (in natural juice), or frozen fruits. Vegetables  Fresh or frozen vegetables (raw, steamed, roasted, or grilled). Green salads. Grains  Whole grains, such as whole wheat or whole grain breads, crackers, cereals, and pasta. Unsweetened oatmeal, bulgur, barley, quinoa, or brown rice. Corn or whole wheat flour tortillas. Meats and other protein foods  Ground beef (85% or leaner), grass-fed beef, or beef trimmed of fat. Skinless chicken or  [place]. Ground chicken or [place]. Pork trimmed of fat. All fish and seafood. Egg whites. Dried beans, peas, or lentils. Unsalted nuts or seeds. Unsalted canned beans. Nut butters without added sugar or oil. Dairy  Low-fat or nonfat dairy products, such as skim or 1% milk, 2% or reduced-fat cheeses, low-fat and fat-free ricotta or cottage cheese, or plain low-fat and nonfat yogurt. Fats and oils  Tub margarine without trans fats. Light or reduced-fat mayonnaise and salad dressings. Avocado. Olive, canola,  sesame, or safflower oils. The items listed above may not be a complete list of foods and beverages you can eat. Contact a dietitian for more information. Foods to avoid Fruits  Canned fruit in heavy syrup. Fruit in cream or butter sauce. Fried fruit. Vegetables  Vegetables cooked in cheese, cream, or butter sauce. Fried vegetables. Grains  White bread. White pasta. White rice. Cornbread. Bagels, pastries, and croissants. Crackers and snack foods that contain trans fat and hydrogenated oils. Meats and other protein foods  Fatty cuts of meat. Ribs, chicken wings, bacon, sausage, bologna, salami, chitterlings, fatback, hot dogs, bratwurst, and packaged lunch meats. Liver and organ meats. Whole eggs and egg yolks. Chicken and [place] with skin. Fried meat. Dairy  Whole or 2% milk, cream, half-and-half, and cream cheese. Whole milk cheeses. Whole-fat or sweetened yogurt. Full-fat cheeses. Nondairy creamers and whipped toppings. Processed cheese, cheese spreads, and cheese curds. Beverages  Alcohol. Sugar-sweetened drinks such as sodas, lemonade, and fruit drinks. Fats and oils  Butter, stick margarine, lard, shortening, ghee, or bacon fat. Coconut, palm kernel, and palm oils. Sweets and desserts  Corn syrup, sugars, honey, and molasses. Candy. Jam and jelly. Syrup. Sweetened cereals. Cookies, pies, cakes, donuts, muffins, and ice cream. The items listed above may not be a complete list of foods and beverages you should avoid. Contact a dietitian for more information. Summary  Choosing the right foods helps keep your fat and cholesterol at normal levels. This can keep you from getting certain diseases.  At meals, fill one-half of your plate with vegetables and green salads.  Eat high-fiber foods, like whole grains, beans, apples, carrots, peas, and barley.  Limit added sugar, saturated fats, alcohol, and fried foods. This information is not intended to replace advice given to you by  your health care provider. Make sure you discuss any questions you have with your health care provider. Document Revised: 03/01/2018 Document Reviewed: 03/15/2017 Elsevier Patient Education  2020 [employer].

## 2020-01-10 ENCOUNTER — Other Ambulatory Visit: Payer: Self-pay | Admitting: Family Medicine

## 2020-01-10 DIAGNOSIS — Z1231 Encounter for screening mammogram for malignant neoplasm of breast: Secondary | ICD-10-CM

## 2020-01-15 ENCOUNTER — Other Ambulatory Visit: Payer: Self-pay

## 2020-01-15 ENCOUNTER — Ambulatory Visit
Admission: RE | Admit: 2020-01-15 | Discharge: 2020-01-15 | Disposition: A | Discharge status: Home / Self Care | Payer: BC Managed Care – PPO | Source: Ambulatory Visit | Attending: Family Medicine | Admitting: Family Medicine

## 2020-01-15 DIAGNOSIS — Z1231 Encounter for screening mammogram for malignant neoplasm of breast: Secondary | ICD-10-CM

## 2020-03-03 IMAGING — MG DIGITAL SCREENING BILATERAL MAMMOGRAM WITH TOMO AND CAD
8 series · 8 of 24 positions shown · non-contrast
Comparison: Previous exam(s).

CLINICAL DATA: Screening.

EXAM:
DIGITAL SCREENING BILATERAL MAMMOGRAM WITH TOMO AND CAD

[L MLO synth-2D]
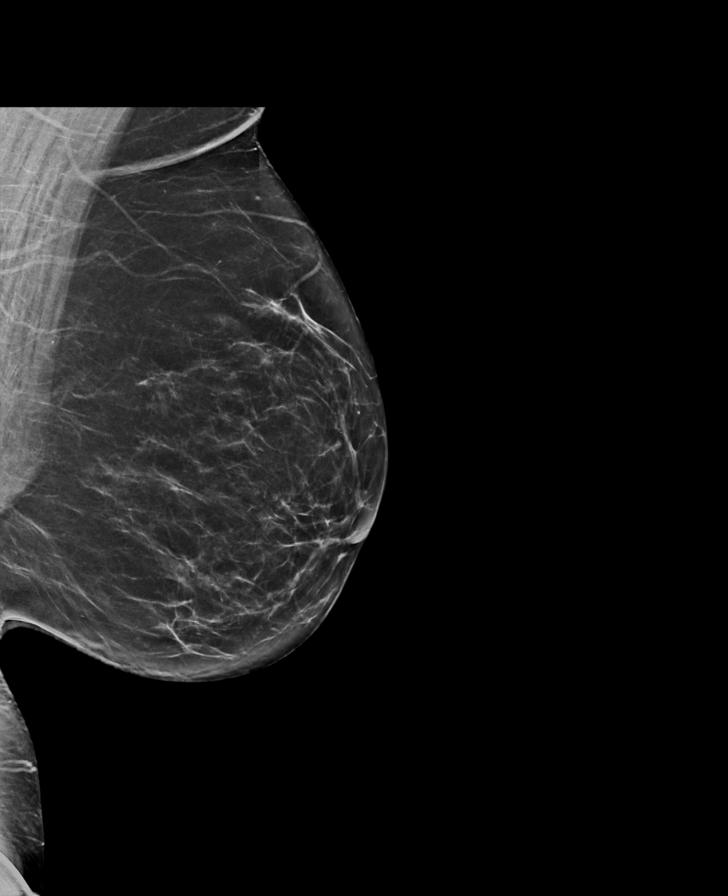

[R MLO synth-2D]
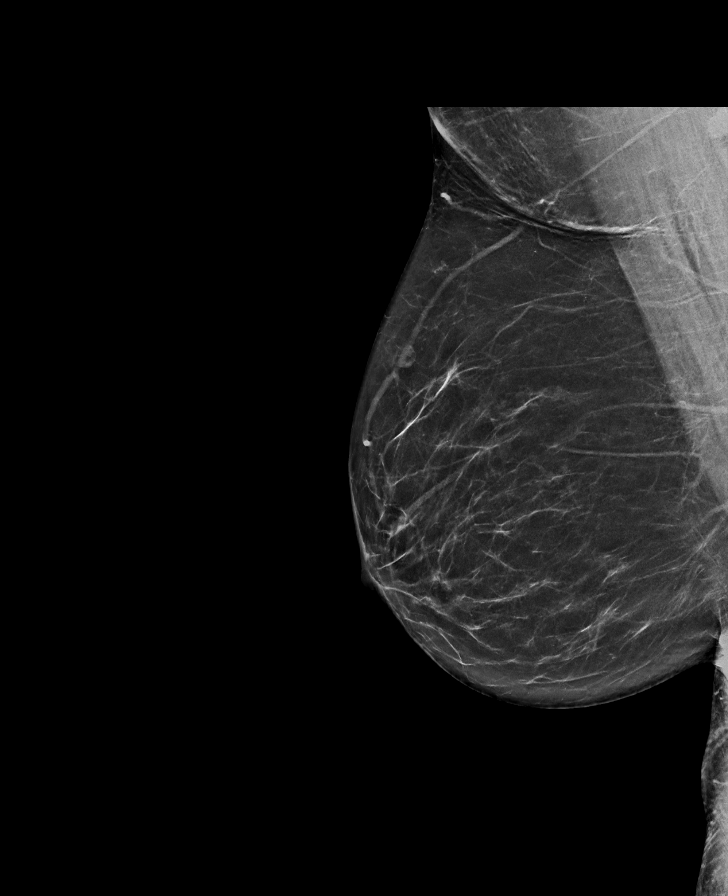

[R CC synth-2D]
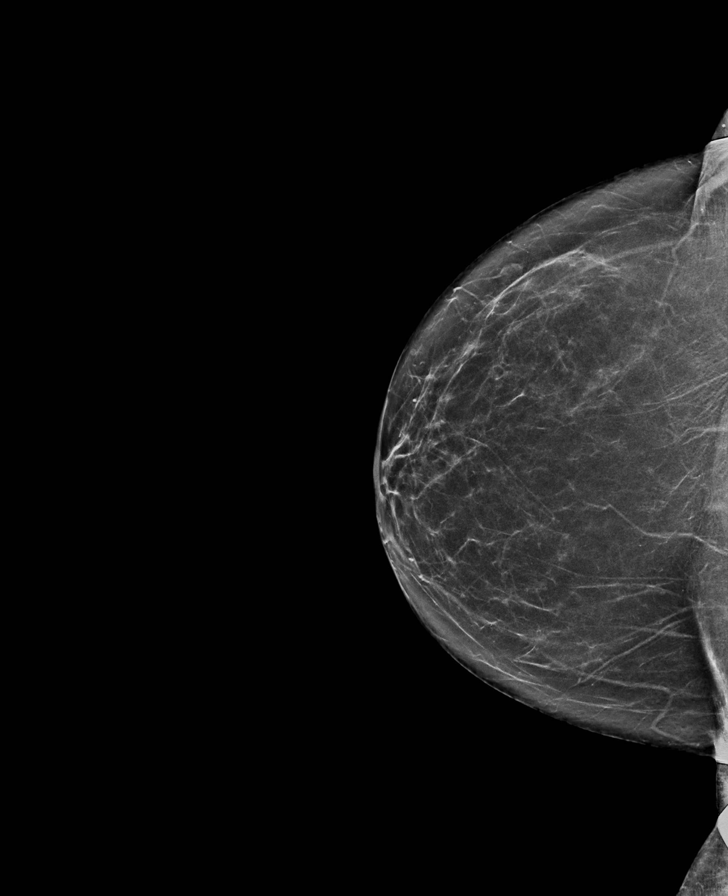

[L CC synth-2D]
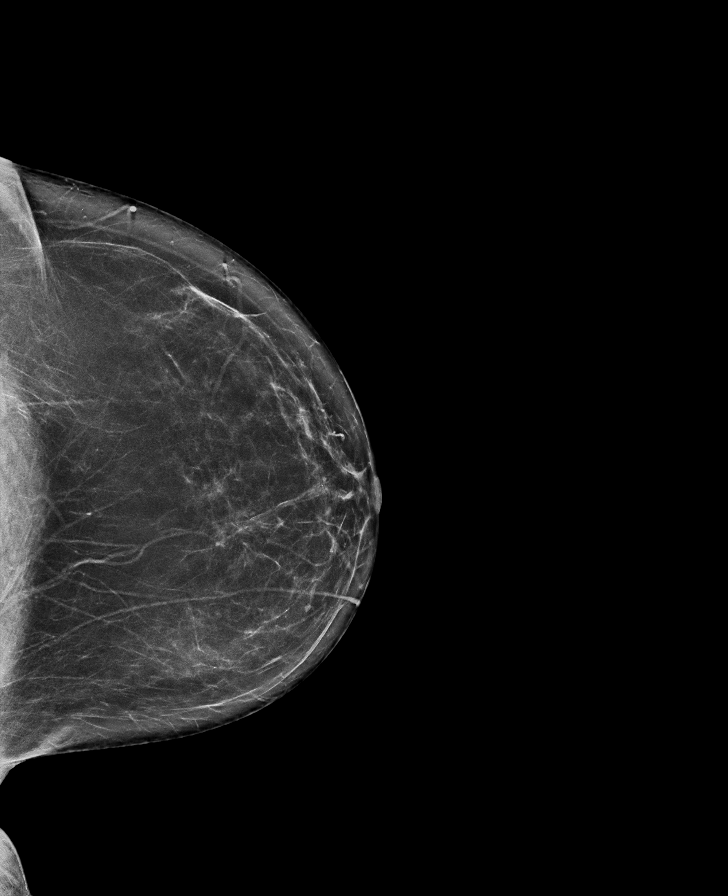

[L CC tomo · tomo slice 37/72.0]
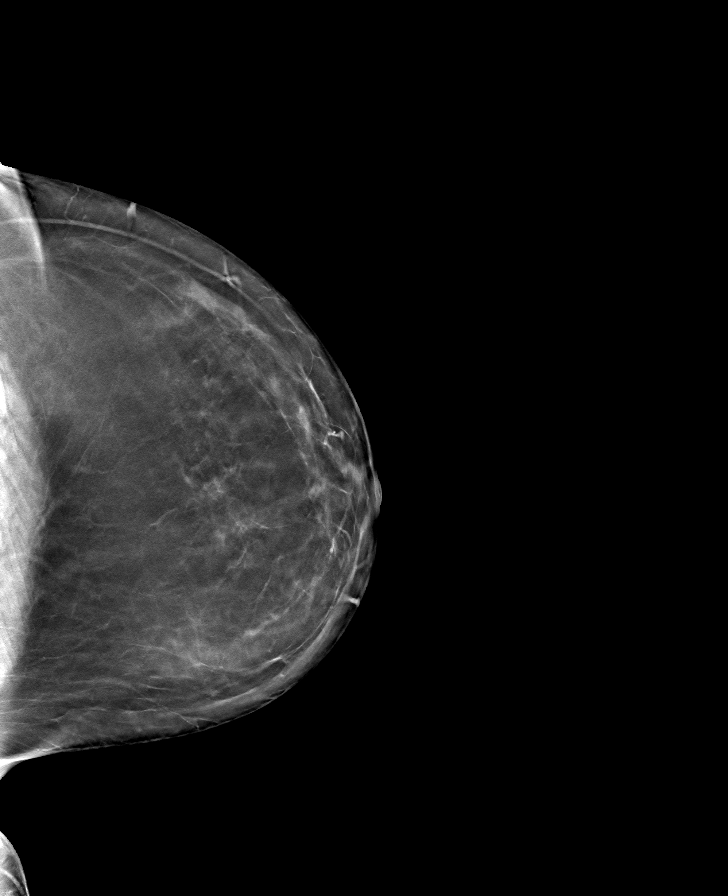

[R CC tomo · tomo slice 32/63.0]
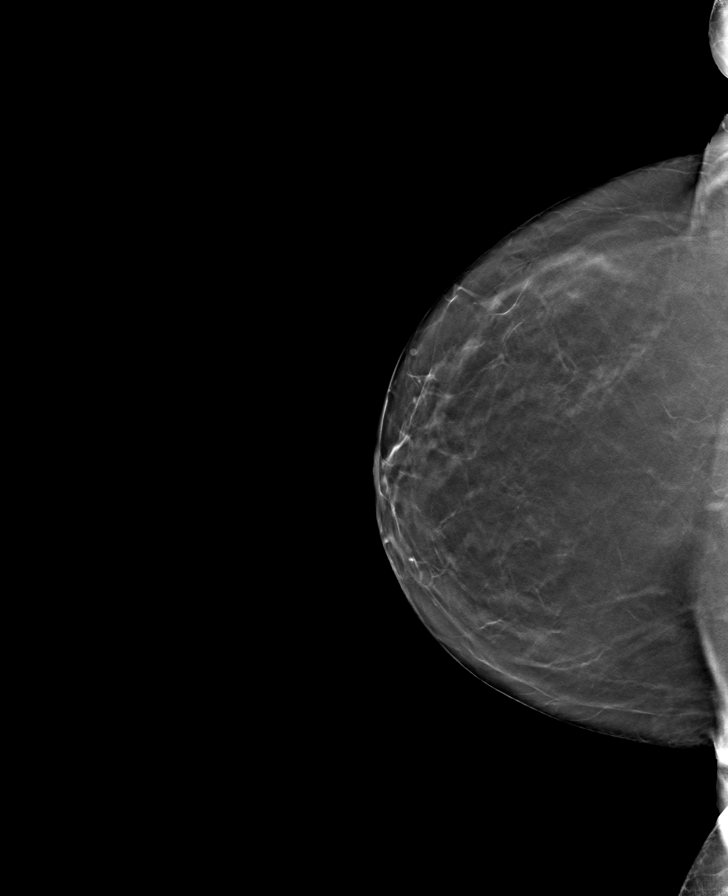

[R MLO tomo · tomo slice 37/73.0]
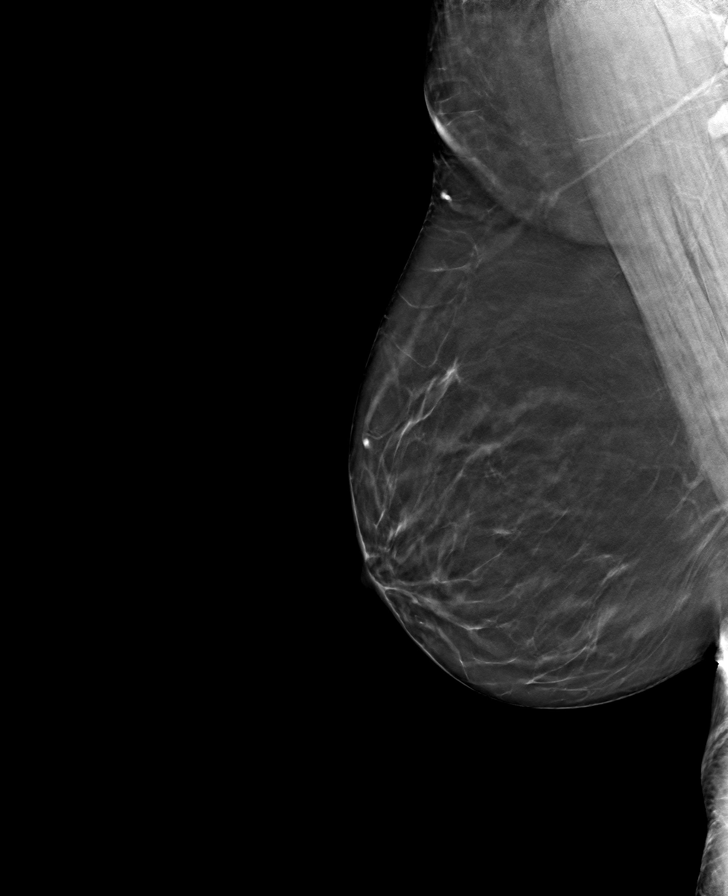

[L MLO tomo · tomo slice 33/65.0]
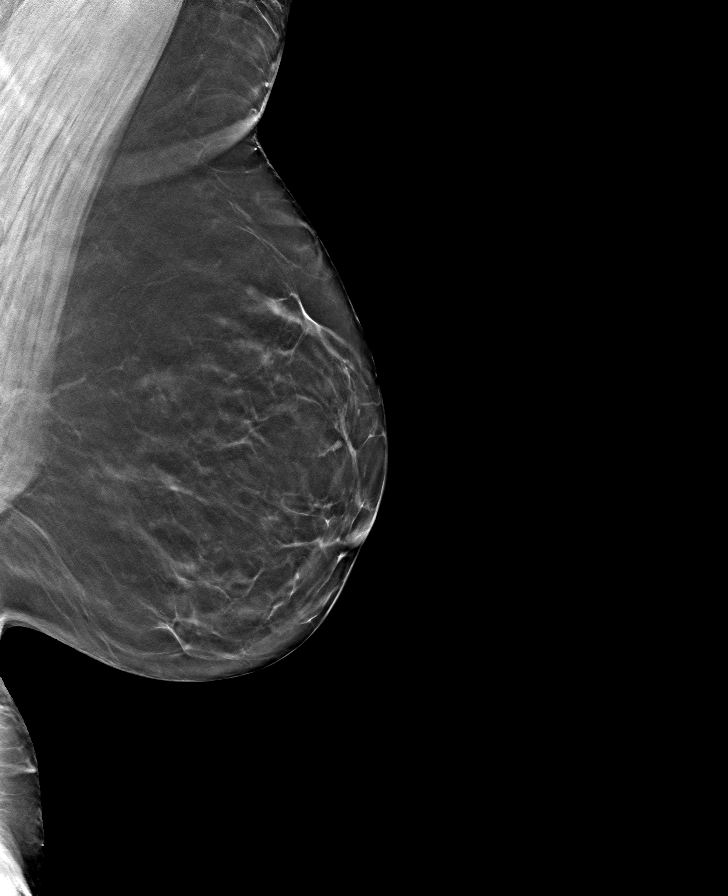

[8 of 24 positions shown; findings below may reference images not displayed]

ACR Breast Density Category b: There are scattered areas of
fibroglandular density.
FINDINGS: There are no findings suspicious for malignancy. Images were
processed with CAD.
IMPRESSION: No mammographic evidence of malignancy. A result letter of this
screening mammogram will be mailed directly to the patient.

RECOMMENDATION:
Screening mammogram in one year. (Code:[id number])

BI-RADS CATEGORY  1: Negative.

## 2020-12-02 ENCOUNTER — Encounter: Payer: Self-pay | Admitting: Family Medicine

## 2020-12-03 NOTE — Telephone Encounter (Signed)
Pt is scheduled with Dr. Mardelle Matte on 5/27 at 11:00 am.

## 2020-12-05 ENCOUNTER — Encounter: Payer: Self-pay | Admitting: Family Medicine

## 2020-12-05 ENCOUNTER — Ambulatory Visit: Payer: BC Managed Care – PPO | Admitting: Family Medicine

## 2020-12-05 ENCOUNTER — Ambulatory Visit (INDEPENDENT_AMBULATORY_CARE_PROVIDER_SITE_OTHER): Payer: BC Managed Care – PPO

## 2020-12-05 ENCOUNTER — Other Ambulatory Visit: Payer: Self-pay

## 2020-12-05 VITALS — BP 130/84 | HR 67 | Temp 98.0°F | Ht 69.0 in | Wt 243.2 lb

## 2020-12-05 DIAGNOSIS — M25551 Pain in right hip: Secondary | ICD-10-CM

## 2020-12-05 DIAGNOSIS — R2242 Localized swelling, mass and lump, left lower limb: Secondary | ICD-10-CM

## 2020-12-05 MED ORDER — AMOXICILLIN-POT CLAVULANATE 875-125 MG PO TABS
1.0000 | ORAL_TABLET | Freq: Two times a day (BID) | ORAL | 0 refills | Status: DC
Start: 2020-12-05 — End: 2020-12-26

## 2020-12-05 NOTE — Patient Instructions (Signed)
Please follow up as scheduled for your next visit with me: 12/26/2020   If you have any questions or concerns, please don't hesitate to send me a message via MyChart or call the office at [phone]. Thank you for visiting with [place] today! It's our pleasure caring for you.

## 2020-12-05 NOTE — Progress Notes (Signed)
Subjective  CC:  Chief Complaint  Patient presents with  . Leg Sore    Left leg, noticed it on Saturday. Painful to the touch     HPI: Meghan Martinez is a 65 y.o. female who presents to the office today to address the problems listed above in the chief complaint.  5 days ago noted a red lump on lower left leg with surrounding redness. The lump was firm and painful "pressure". She used a needle which relieved the pressure and drained a little blood. No pus. Pain subsided; now remains sore. The surrounding redness is improved but still present. No other sxs. No itching, draining, or injury. No f/c/s. Feels otherwise well.   Also with worsening right hip pain x 6 months. Now limiting activity. Pain "at the joint". Worse with walking. Has h/o right total knee replacement and thinks gait is off because of it. (foot turns outward since surgery). No injury. No limp. Hasn't taken meds. Not painful on outside of hip. Sleeps fine.    Assessment  1. Nodule of skin of left lower leg   2. Right hip pain      Plan   Nodular lesion of leg:  Unclear etiology. Does not appear to be spider/insect bite or sting/no pit so not c/w keratocanthoma. No visible blood vessels and large so doubt basal cell carcinoma; possible infection: will treat with abx and monitor. May need skin biopsy if doesn't clear.   Check xrays: ? OA of hip.   Follow up:   12/26/2020  Orders Placed This Encounter  Procedures  . DG HIP UNILAT W OR W/O PELVIS 2-3 VIEWS RIGHT   Meds ordered this encounter  Medications  . amoxicillin-clavulanate (AUGMENTIN) 875-125 MG tablet    Sig: Take 1 tablet by mouth 2 (two) times daily.    Dispense:  14 tablet    Refill:  0      I reviewed the patients updated PMH, FH, and SocHx.    Patient Active Problem List   Diagnosis Date Noted  . Obesity (BMI 30-39.9) 12/26/2019  . 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 Benign colon polyp 03/17/2016  . Family history of  colon cancer 08/22/2014  . Family history of premature CAD 08/22/2014   Current Meds  Medication Sig  . amoxicillin-clavulanate (AUGMENTIN) 875-125 MG tablet Take 1 tablet by mouth 2 (two) times daily.    Allergies: Patient has No Known Allergies. Family History: Patient family history includes Cancer in her brother, mother, and sister; Lung disease in her father. Social History:  Patient  reports that she has quit smoking. Her smoking use included cigars. She has never used smokeless tobacco. She reports current alcohol use. She reports that she does not use drugs.  Review of Systems: Constitutional: Negative for fever malaise or anorexia Cardiovascular: negative for chest pain Respiratory: negative for SOB or persistent cough Gastrointestinal: negative for abdominal pain  Objective  Vitals: BP 130/84   Pulse 67   Temp 98 F (36.7 C) (Temporal)   Ht 5\' 9"  (1.753 m)   Wt 243 lb 3.2 oz (110.3 kg)   SpO2 97%   BMI 35.91 kg/m  General: no acute distress , A&Ox3  Skin:  Right lower leg quarter sized red nodule w/o fluctuance or warmth. Smooth, dome shaped w/o central pit. No flaking. No vesicles. Mild surrounding erythema w/o streaking.      Commons side effects, risks, benefits, and alternatives for medications and treatment plan prescribed today were  discussed, and the patient expressed understanding of the given instructions. Patient is instructed to call or message via MyChart if he/she has any questions or concerns regarding our treatment plan. No barriers to understanding were identified. We discussed Red Flag symptoms and signs in detail. Patient expressed understanding regarding what to do in case of urgent or emergency type symptoms.   Medication list was reconciled, printed and provided to the patient in AVS. Patient instructions and summary information was reviewed with the patient as documented in the AVS. This note was prepared with assistance of Dragon voice  recognition software. Occasional wrong-word or sound-a-like substitutions may have occurred due to the inherent limitations of voice recognition software  This visit occurred during the SARS-CoV-2 public health emergency.  Safety protocols were in place, including screening questions prior to the visit, additional usage of staff PPE, and extensive cleaning of exam room while observing appropriate contact time as indicated for disinfecting solutions.

## 2020-12-10 ENCOUNTER — Encounter: Payer: Self-pay | Admitting: Family Medicine

## 2020-12-10 DIAGNOSIS — M1611 Unilateral primary osteoarthritis, right hip: Secondary | ICD-10-CM | POA: Insufficient documentation

## 2020-12-10 HISTORY — DX: Unilateral primary osteoarthritis, right hip: M16.11

## 2020-12-26 ENCOUNTER — Encounter: Payer: Self-pay | Admitting: Family Medicine

## 2020-12-26 ENCOUNTER — Other Ambulatory Visit: Payer: Self-pay

## 2020-12-26 ENCOUNTER — Ambulatory Visit (INDEPENDENT_AMBULATORY_CARE_PROVIDER_SITE_OTHER): Payer: BC Managed Care – PPO | Admitting: Family Medicine

## 2020-12-26 VITALS — BP 114/71 | HR 67 | Temp 99.1°F | Ht 69.0 in | Wt 243.2 lb

## 2020-12-26 DIAGNOSIS — M1611 Unilateral primary osteoarthritis, right hip: Secondary | ICD-10-CM | POA: Diagnosis not present

## 2020-12-26 DIAGNOSIS — Z0001 Encounter for general adult medical examination with abnormal findings: Secondary | ICD-10-CM

## 2020-12-26 DIAGNOSIS — Z23 Encounter for immunization: Secondary | ICD-10-CM

## 2020-12-26 DIAGNOSIS — Z0000 Encounter for general adult medical examination without abnormal findings: Secondary | ICD-10-CM

## 2020-12-26 DIAGNOSIS — L723 Sebaceous cyst: Secondary | ICD-10-CM

## 2020-12-26 DIAGNOSIS — Z780 Asymptomatic menopausal state: Secondary | ICD-10-CM | POA: Diagnosis not present

## 2020-12-26 DIAGNOSIS — L089 Local infection of the skin and subcutaneous tissue, unspecified: Secondary | ICD-10-CM

## 2020-12-26 LAB — LIPID PANEL
Cholesterol: 199 mg/dL (ref 0–200)
HDL: 45.9 mg/dL (ref >39.00)
LDL Cholesterol: 137 mg/dL — ABNORMAL HIGH (ref 0–99)
NonHDL: 152.71
Total CHOL/HDL Ratio: 4
Triglycerides: 79 mg/dL (ref 0.0–149.0)
VLDL: 15.8 mg/dL (ref 0.0–40.0)

## 2020-12-26 LAB — COMPREHENSIVE METABOLIC PANEL
ALT: 15 U/L (ref 0–35)
AST: 15 U/L (ref 0–37)
Albumin: 4.3 g/dL (ref 3.5–5.2)
Alkaline Phosphatase: 83 U/L (ref 39–117)
BUN: 18 mg/dL (ref 6–23)
CO2: 26 mEq/L (ref 19–32)
Calcium: 9.7 mg/dL (ref 8.4–10.5)
Chloride: 104 mEq/L (ref 96–112)
Creatinine, Ser: 0.83 mg/dL (ref 0.40–1.20)
GFR: 74.06 mL/min (ref >60.00)
Glucose, Bld: 96 mg/dL (ref 70–99)
Potassium: 4.2 mEq/L (ref 3.5–5.1)
Sodium: 139 mEq/L (ref 135–145)
Total Bilirubin: 0.6 mg/dL (ref 0.2–1.2)
Total Protein: 7.1 g/dL (ref 6.0–8.3)

## 2020-12-26 LAB — CBC WITH DIFFERENTIAL/PLATELET
Basophils Absolute: 0.1 10*3/uL (ref 0.0–0.1)
Basophils Relative: 1.2 % (ref 0.0–3.0)
Eosinophils Absolute: 0.1 10*3/uL (ref 0.0–0.7)
Eosinophils Relative: 2.1 % (ref 0.0–5.0)
HCT: 38.5 % (ref 36.0–46.0)
Hemoglobin: 12.9 g/dL (ref 12.0–15.0)
Lymphocytes Relative: 27.1 % (ref 12.0–46.0)
Lymphs Abs: 1.6 10*3/uL (ref 0.7–4.0)
MCHC: 33.6 g/dL (ref 30.0–36.0)
MCV: 87.5 fl (ref 78.0–100.0)
Monocytes Absolute: 0.5 10*3/uL (ref 0.1–1.0)
Monocytes Relative: 9.2 % (ref 3.0–12.0)
Neutro Abs: 3.5 10*3/uL (ref 1.4–7.7)
Neutrophils Relative %: 60.4 % (ref 43.0–77.0)
Platelets: 253 10*3/uL (ref 150.0–400.0)
RBC: 4.4 Mil/uL (ref 3.87–5.11)
RDW: 12.7 % (ref 11.5–15.5)
WBC: 5.7 10*3/uL (ref 4.0–10.5)

## 2020-12-26 LAB — TSH: TSH: 2.14 u[IU]/mL (ref 0.35–4.50)

## 2020-12-26 MED ORDER — DOXYCYCLINE HYCLATE 100 MG PO TABS
100.0000 mg | ORAL_TABLET | Freq: Two times a day (BID) | ORAL | 0 refills | Status: AC
Start: 2020-12-26 — End: 2021-01-02

## 2020-12-26 NOTE — Progress Notes (Signed)
Subjective  Chief Complaint  Patient presents with   Annual Exam    Fasting    HPI: Meghan Martinez is a 65 y.o. female who presents to [facility] [place] today for a Female Wellness Visit. She also has the concerns and/or needs as listed above in the chief complaint. These will be addressed in addition to the Health Maintenance Visit.   Wellness Visit: annual visit with health maintenance review and exam without Pap  HM: mamm up to date. Due screening dexa. Imms: prevnar today Chronic disease f/u and/or acute problem visit: (deemed necessary to be done in addition to the wellness visit): OA hip, severe by xray: Has intermittent pain.  She is open to seeing orthopedics.  She prefer replacement sooner rather than later. Skin lesion on left shin: Antibiotics have helped little bit but abnormality persist.  Assessment  1. Annual physical exam   2. Primary osteoarthritis of right hip   3. Asymptomatic menopausal state   4. Immunization due   5. Infected sebaceous cyst      Plan  Female Wellness Visit: Age appropriate Health Maintenance and Prevention measures were discussed with patient. Included topics are cancer screening recommendations, ways to keep healthy (see AVS) including dietary and exercise recommendations, regular eye and dental care, use of seat belts, and avoidance of moderate alcohol use and tobacco use.  BMI: discussed patient's BMI and encouraged positive lifestyle modifications to help get to or maintain a target BMI. HM needs and immunizations were addressed and ordered. See below for orders. See HM and immunization section for updates.  Prevnar today, Pneumovax next year Routine labs and screening tests ordered including cmp, cbc and lipids where appropriate. Discussed recommendations regarding Vit D and calcium supplementation (see AVS)  Chronic disease management visit and/or acute problem visit: Sebaceous cyst, infected: Status post I&D.   Will treat with another round of antibiotics: Doxycycline 100 twice daily x7 days.  Education given.  If becomes enlarged again, can send to [facility] for excision. Osteoarthritis hip: Refer to orthopedics for evaluation.  Follow up: Return in about 1 year (around 12/26/2021) for complete physical.  Orders Placed This Encounter  Procedures   DG Bone Density   Pneumococcal conjugate vaccine 13-valent   CBC with Differential/Platelet   Comprehensive metabolic panel   Lipid panel   TSH   Ambulatory referral to Orthopedic Surgery   Meds ordered this encounter  Medications   doxycycline (VIBRA-TABS) 100 MG tablet    Sig: Take 1 tablet (100 mg total) by mouth 2 (two) times daily for 7 days.    Dispense:  14 tablet    Refill:  0      Body mass index is 35.91 kg/m. Wt Readings from Last 3 Encounters:  12/26/20 243 lb 3.2 oz (110.3 kg)  12/05/20 243 lb 3.2 oz (110.3 kg)  12/26/19 263 lb (119.3 kg)     Patient Active Problem List   Diagnosis Date Noted   Osteoarthritis of right hip 12/10/2020    Severe by x-ray May 2022     Obesity (BMI 30-39.9) 12/26/2019   Mixed stress and urge urinary incontinence 09/15/2017   Benign colon polyp 03/17/2016   Family history of colon cancer 08/22/2014   Family history of premature CAD 08/22/2014   Health Maintenance  Topic Date Due   COVID-19 Vaccine (4 - Booster for Pfizer series) 09/19/2020   DEXA SCAN  Never done   MAMMOGRAM  01/14/2021   INFLUENZA VACCINE  02/09/2021  PNA vac Low Risk Adult (2 of 2 - PPSV23) 12/26/2021   TETANUS/TDAP  11/25/2024   COLONOSCOPY (Pts 45-76yrs Insurance coverage will need to be confirmed)  07/14/2025   Hepatitis C Screening  Completed   HIV Screening  Completed   Zoster Vaccines- Shingrix  Completed   HPV VACCINES  Aged Out   PAP SMEAR-Modifier  Discontinued   Immunization History  Administered Date(s) Administered   PFIZER(Purple Top)SARS-COV-2 Vaccination 09/16/2019, 10/16/2019, 05/22/2020    Pneumococcal Conjugate-13 12/26/2020   Tdap 11/26/2014   Zoster Recombinat (Shingrix) 12/25/2018, 06/26/2019   Zoster, Live 03/17/2016   We updated and reviewed the patient's past history in detail and it is documented below. Allergies: Patient has No Known Allergies. Past Medical History Patient  has a past medical history of History of chickenpox and Obesity (BMI 30-39.9) (12/26/2019). Past Surgical History Patient  has a past surgical history that includes Replacement total knee bilateral; Abdominal hysterectomy; and Carpal tunnel release (Left). Family History: Patient family history includes Cancer in her brother, mother, and sister; Lung disease in her father. Social History:  Patient  reports that she has quit smoking. Her smoking use included cigars. She has never used smokeless tobacco. She reports current alcohol use. She reports that she does not use drugs.  Review of Systems: Constitutional: negative for fever or malaise Ophthalmic: negative for photophobia, double vision or loss of vision Cardiovascular: negative for chest pain, dyspnea on exertion, or new LE swelling Respiratory: negative for SOB or persistent cough Gastrointestinal: negative for abdominal pain, change in bowel habits or melena Genitourinary: negative for dysuria or gross hematuria, no abnormal uterine bleeding or disharge Musculoskeletal: negative for new gait disturbance or muscular weakness Integumentary: negative for new or persistent rashes, no breast lumps Neurological: negative for TIA or stroke symptoms Psychiatric: negative for SI or delusions Allergic/Immunologic: negative for hives  Patient Care Team    Relationship Specialty Notifications Start End  Willow Ora, MD PCP - General Family Medicine  09/15/17   Ditty, Loura Halt, MD Consulting Physician Neurosurgery  09/15/17     Objective  Vitals: BP 114/71   Pulse 67   Temp 99.1 F (37.3 C) (Temporal)   Ht 5\' 9"  (1.753 m)   Wt 243  lb 3.2 oz (110.3 kg)   SpO2 98%   BMI 35.91 kg/m  General:  Well developed, well nourished, no acute distress  Psych:  Alert and orientedx3,normal mood and affect HEENT:  Normocephalic, atraumatic, non-icteric sclera,  supple neck without adenopathy, mass or thyromegaly Cardiovascular:  Normal S1, S2, RRR without gallop, rub or murmur Respiratory:  Good breath sounds bilaterally, CTAB with normal respiratory effort Gastrointestinal: normal bowel sounds, soft, non-tender, no noted masses. No HSM MSK: no deformities, contusions. Joints are without erythema or swelling.  Skin:  Warm, no rashes or suspicious lesions noted, left shin with scabbed erythematous raised lesion, can express pus Neurologic:    Mental status is normal. CN 2-11 are normal. Gross motor and sensory exams are normal. Normal gait. No tremor Breast Exam: No mass, skin retraction or nipple discharge is appreciated in either breast. No axillary adenopathy. Fibrocystic changes are not noted  Incision and Drainage Procedure Note  Pre-operative Diagnosis: abscess  Post-operative Diagnosis: sebaceous cyst, infected  Indications: therapeutic  Anesthesia: 1-4 cc of 1%lidocaine with epi  Procedure Details  The procedure, risks and complications have been discussed in detail (including, but not limited to airway compromise, infection, bleeding) with the patient, and the patient has signed consent  to the procedure.  The skin was sterilely prepped and draped over the affected area in the usual fashion. After adequate local anesthesia, I&D with a #11 blade was performed on the left shin. Purulent drainage: present, along with se.bum The patient was observed until stable.  Findings: Sebum and pus, small amount  EBL: 0 cc's  Wound was closed with steri strip and bandaged  Condition: Tolerated procedure well and Stable  Commons side effects, risks, benefits, and alternatives for medications and treatment plan prescribed today  were discussed, and the patient expressed understanding of the given instructions. Patient is instructed to call or message via MyChart if he/she has any questions or concerns regarding our treatment plan. No barriers to understanding were identified. We discussed Red Flag symptoms and signs in detail. Patient expressed understanding regarding what to do in case of urgent or emergency type symptoms.  Medication list was reconciled, printed and provided to the patient in AVS. Patient instructions and summary information was reviewed with the patient as documented in the AVS. This note was prepared with assistance of Dragon voice recognition software. Occasional wrong-word or sound-a-like substitutions may have occurred due to the inherent limitations of voice recognition software  This visit occurred during the SARS-CoV-2 public health emergency.  Safety protocols were in place, including screening questions prior to the visit, additional usage of staff PPE, and extensive cleaning of exam room while observing appropriate contact time as indicated for disinfecting solutions.

## 2020-12-26 NOTE — Patient Instructions (Addendum)
Please return in 12 months for your annual complete physical; please come fasting.   I will release your lab results to you on your MyChart account with further instructions. Please reply with any questions.    Today you were given your Prevnar 13 vaccination.    We will call you with information regarding your referral appointment. Dr. Allie Bossier of orthopedic surgery for your hip.  If you do not hear from [place] within the next 2 weeks, please let me know. It can take 1-2 weeks to get appointments set up with the specialists.   I have ordered a mammogram and/or bone density for you as we discussed today: []   Mammogram  [x]   Bone Density  Please call the office checked below to schedule your appointment:  [x]   [facility]      [address] [place],        [address] Wing         []   [facility]  [address] [place],  [facility]  If you have any questions or concerns, please don't hesitate to send me a message via MyChart or call the office at [phone]. Thank you for visiting with [place] today! It's our pleasure caring for you.

## 2020-12-31 ENCOUNTER — Other Ambulatory Visit: Payer: Self-pay

## 2020-12-31 ENCOUNTER — Encounter: Payer: Self-pay | Admitting: Orthopaedic Surgery

## 2020-12-31 ENCOUNTER — Ambulatory Visit: Payer: BC Managed Care – PPO | Admitting: Orthopaedic Surgery

## 2020-12-31 VITALS — Ht 69.0 in | Wt 243.2 lb

## 2020-12-31 DIAGNOSIS — M1611 Unilateral primary osteoarthritis, right hip: Secondary | ICD-10-CM | POA: Diagnosis not present

## 2020-12-31 NOTE — Progress Notes (Addendum)
Office Visit Note   Patient: Meghan Martinez           Date of Birth: 11-03-1955           MRN: [id number] Visit Date: 12/31/2020              Requested by: Willow Ora, MD [address] [place],  [place] [address] PCP: Willow Ora, MD   Assessment & Plan: Visit Diagnoses:  1. Primary osteoarthritis of right hip     Plan: Discussed conservative options with patient.  She would like to proceed with right total hip arthroplasty in the near future.  Questions were encouraged and answered by Dr. Magnus Ivan and myself.  Risk benefits of surgery discussed with her.  Risk including but not limited to leg length discrepancy, wound healing problems, infection, nerve vessel injury, DVT/PE and prolonged pain discussed with patient at length.  Postoperative protocol was discussed with her.  We will work on getting her scheduled for surgery.  She understands we would have to cancel surgery if the wound on the left leg is not completely healed.  Follow-Up Instructions: 2 weeks postop   Orders:  No orders of the defined types were placed in this encounter.  No orders of the defined types were placed in this encounter.     Procedures: No procedures performed   Clinical Data: No additional findings.   Subjective: Chief Complaint  Patient presents with   Right Hip - Pain    HPI Meghan Martinez is a 65 year old female comes in today with right hip pain has been ongoing for the past 2 to 3 years.  Pain is getting worse.  Pain is in the groin area.  She has tried ibuprofen.  Uses no assistive devices.  She has trouble with transfers due to the pain in the right hip.  She works at [employer] was referred by Lesle Chris.  History of bilateral knee replacements 2006 2010 both knees doing well.  Patient is currently on doxycycline due to sebaceous cyst left lower leg. Radiographs are personally reviewed and an AP pelvis lateral view of the right hip shows severe right hip  osteoarthritis.  There is cystic changes within the femoral head.  The head is misshapen.  Periarticular osteophytes are noted.  Near bone-on-bone arthritis involving the right hip.  Left hip is well maintained.  Review of Systems Denies any fevers or chills.  Positive cyst left lower leg.  Objective: Vital Signs: Ht 5\' 9"  (1.753 m)   Wt 243 lb 3.2 oz (110.3 kg)   BMI 35.91 kg/m   Physical Exam Constitutional:      Appearance: She is not ill-appearing or diaphoretic.  Pulmonary:     Effort: Pulmonary effort is normal.  Neurological:     Mental Status: She is alert and oriented to person, place, and time.  Psychiatric:        Mood and Affect: Mood normal.    Ortho Exam Bilateral hips left hip excellent range of motion without pain.  Right hip limited external rotation and virtually no internal rotation with pain.  Calves are supple nontender.  Bilateral knees status post bilateral total knee arthroplasties well-healed surgical incisions good range of motion both knees without pain.  Left lower medial tibia with area of erythema and ulceration.  No drainage. Specialty Comments:  No specialty comments available.  Imaging: No results found.   PMFS History: Patient Active Problem List   Diagnosis Date Noted  Osteoarthritis of right hip 12/10/2020   Obesity (BMI 30-39.9) 12/26/2019   Mixed stress and urge urinary incontinence 09/15/2017   Benign colon polyp 03/17/2016   Family history of colon cancer 08/22/2014   Family history of premature CAD 08/22/2014   Past Medical History:  Diagnosis Date   History of chickenpox    Obesity (BMI 30-39.9) 12/26/2019    Family History  Problem Relation Age of Onset   Cancer Mother        Colon   Lung disease Father    Cancer Sister        Lung   Cancer Brother        Colon    Past Surgical History:  Procedure Laterality Date   ABDOMINAL HYSTERECTOMY     CARPAL TUNNEL RELEASE Left    REPLACEMENT TOTAL KNEE BILATERAL      Social History   Occupational History   Not on file  Tobacco Use   Smoking status: Former    Pack years: 0.00    Types: Cigars   Smokeless tobacco: Never  Vaping Use   Vaping Use: Never used  Substance and Sexual Activity   Alcohol use: Yes    Comment: occasional   Drug use: No   Sexual activity: Never

## 2021-01-07 ENCOUNTER — Other Ambulatory Visit: Payer: Self-pay | Admitting: Family Medicine

## 2021-01-07 DIAGNOSIS — Z1231 Encounter for screening mammogram for malignant neoplasm of breast: Secondary | ICD-10-CM

## 2021-01-15 ENCOUNTER — Ambulatory Visit
Admission: RE | Admit: 2021-01-15 | Discharge: 2021-01-15 | Disposition: A | Discharge status: Home / Self Care | Payer: BC Managed Care – PPO | Source: Ambulatory Visit | Attending: Family Medicine | Admitting: Family Medicine

## 2021-01-15 ENCOUNTER — Other Ambulatory Visit: Payer: Self-pay

## 2021-01-15 DIAGNOSIS — Z0000 Encounter for general adult medical examination without abnormal findings: Secondary | ICD-10-CM

## 2021-01-15 DIAGNOSIS — Z780 Asymptomatic menopausal state: Secondary | ICD-10-CM

## 2021-01-15 DIAGNOSIS — Z1231 Encounter for screening mammogram for malignant neoplasm of breast: Secondary | ICD-10-CM

## 2021-02-04 ENCOUNTER — Encounter: Payer: Self-pay | Admitting: Family Medicine

## 2021-02-04 DIAGNOSIS — M8580 Other specified disorders of bone density and structure, unspecified site: Secondary | ICD-10-CM | POA: Insufficient documentation

## 2021-02-16 NOTE — Progress Notes (Addendum)
COVID Vaccine Completed: yes x3 Date COVID Vaccine completed: 09/16/19, 10/16/19 Has received booster: 05/22/20 COVID vaccine manufacturer: Pfizer      Date of COVID positive in last 90 days: No  PCP - Asencion Partridge, MD Cardiologist - N/a  Chest x-ray - N/a EKG - N/a Stress Test - N/a ECHO - N/a Cardiac Cath - N/a Pacemaker/ICD device last checked: N/a Spinal Cord Stimulator: N/a  Sleep Study - done, positive CPAP - no CPAP  Fasting Blood Sugar - N/a Checks Blood Sugar _____ times a day  Blood Thinner Instructions: N/a  Aspirin Instructions: Last Dose:  Activity level: Can go up a flight of stairs and perform activities of daily living without stopping and without symptoms of chest pain or shortness of breath.       Anesthesia review:   Patient denies shortness of breath, fever, cough and chest pain at PAT appointment   Patient verbalized understanding of instructions that were given to them at the PAT appointment. Patient was also instructed that they will need to review over the PAT instructions again at home before surgery.

## 2021-02-16 NOTE — Progress Notes (Signed)
Please place orders for PAT visit scheduled 02/17/21.

## 2021-02-16 NOTE — Patient Instructions (Addendum)
DUE TO COVID-19 ONLY ONE VISITOR IS ALLOWED TO COME WITH YOU AND STAY IN THE WAITING ROOM ONLY DURING PRE OP AND PROCEDURE.   **NO VISITORS ARE ALLOWED IN THE SHORT STAY AREA OR RECOVERY ROOM!!**  IF YOU WILL BE ADMITTED INTO THE HOSPITAL YOU ARE ALLOWED ONLY TWO SUPPORT PEOPLE DURING VISITATION HOURS ONLY (10AM -8PM)   The support person(s) may change daily. The support person(s) must pass our screening, gel in and out, and wear a mask at all times, including in the patient's room. Patients must also wear a mask when staff or their support person are in the room.  No visitors under the age of 76. Any visitor under the age of 28 must be accompanied by an adult.    COVID SWAB TESTING MUST BE COMPLETED ON:  02/25/21 **MUST PRESENT COMPLETED FORM AT TESTING SITE**    [address] [place] (backside of the building). Open 8am-3pm. No appointment needed. You are not required to quarantine, however you are required to wear a well-fitted mask when you are out and around people not in your household.  Hand Hygiene often Do NOT share personal items Notify your provider if you are in close contact with someone who has COVID or you develop fever 100.4 or greater, new onset of sneezing, cough, sore throat, shortness of breath or body aches.   Your procedure is scheduled on: 02/27/21   Report to [facility] Main  Entrance    Report to admitting at 8:30 AM   Call this number if you have problems the morning of surgery [phone]   Do not eat food :After Midnight.   May have liquids until  8:00 AM  day of surgery  CLEAR LIQUID DIET  Foods Allowed                                                                     Foods Excluded  Water, Black Coffee and tea, regular and decaf               liquids that you cannot  Plain Jell-O in any flavor  (No red)                                    see through such as: Fruit ices (not with fruit pulp)                                             milk, soups, orange juice              Iced Popsicles (No red)                                               All solid food  Apple juices Sports drinks like Gatorade (No red) Lightly seasoned clear broth or consume(fat free) Sugar, honey syrup     Oral Hygiene is also important to reduce your risk of infection.                                    Remember - BRUSH YOUR TEETH THE MORNING OF SURGERY WITH YOUR REGULAR TOOTHPASTE   Take these medicines the morning of surgery with A SIP OF WATER: None                              You may not have any metal on your body including hair pins, jewelry, and body piercing             Do not wear make-up, lotions, powders, perfumes, or deodorant  Do not wear nail polish including gel and S&S, artificial/acrylic nails, or any other type of covering on natural nails including finger and toenails. If you have artificial nails, gel coating, etc. that needs to be removed by a nail salon please have this removed prior to surgery or surgery may need to be canceled/ delayed if the surgeon/ anesthesia feels like they are unable to be safely monitored.   Do not shave  48 hours prior to surgery.    Do not bring valuables to the hospital. [facility] IS NOT             RESPONSIBLE   FOR VALUABLES.   Bring small overnight bag day of surgery.   Special Instructions: Bring a copy of your healthcare power of attorney and living will documents         the day of surgery if you haven't scanned them in before.   Please read over the following fact sheets you were given: IF YOU HAVE QUESTIONS ABOUT YOUR PRE OP INSTRUCTIONS PLEASE CALL [phone]- [facility] - Preparing for Surgery Before surgery, you can play an important role.  Because skin is not sterile, your skin needs to be as free of germs as possible.  You can reduce the number of germs on your skin by washing with CHG (chlorahexidine gluconate) soap before  surgery.  CHG is an antiseptic cleaner which kills germs and bonds with the skin to continue killing germs even after washing. Please DO NOT use if you have an allergy to CHG or antibacterial soaps.  If your skin becomes reddened/irritated stop using the CHG and inform your nurse when you arrive at Short Stay. Do not shave (including legs and underarms) for at least 48 hours prior to the first CHG shower.  You may shave your face/neck.  Please follow these instructions carefully:  1.  Shower with CHG Soap the night before surgery and the  morning of surgery.  2.  If you choose to wash your hair, wash your hair first as usual with your normal  shampoo.  3.  After you shampoo, rinse your hair and body thoroughly to remove the shampoo.                             4.  Use CHG as you would any other liquid soap.  You can apply chg directly to the skin and wash.  Gently with a scrungie or clean washcloth.  5.  Apply  the CHG Soap to your body ONLY FROM THE NECK DOWN.   Do   not use on face/ open                           Wound or open sores. Avoid contact with eyes, ears mouth and   genitals (private parts).                       Wash face,  Genitals (private parts) with your normal soap.             6.  Wash thoroughly, paying special attention to the area where your    surgery  will be performed.  7.  Thoroughly rinse your body with warm water from the neck down.  8.  DO NOT shower/wash with your normal soap after using and rinsing off the CHG Soap.                9.  Pat yourself dry with a clean towel.            10.  Wear clean pajamas.            11.  Place clean sheets on your bed the night of your first shower and do not  sleep with pets. Day of Surgery : Do not apply any lotions/deodorants the morning of surgery.  Please wear clean clothes to the hospital/surgery center.  FAILURE TO FOLLOW THESE INSTRUCTIONS MAY RESULT IN THE CANCELLATION OF YOUR SURGERY  PATIENT  SIGNATURE_________________________________  NURSE SIGNATURE__________________________________  ________________________________________________________________________  WHAT IS A BLOOD TRANSFUSION? Blood Transfusion Information  A transfusion is the replacement of blood or some of its parts. Blood is made up of multiple cells which provide different functions. Red blood cells carry oxygen and are used for blood loss replacement. White blood cells fight against infection. Platelets control bleeding. Plasma helps clot blood. Other blood products are available for specialized needs, such as hemophilia or other clotting disorders. BEFORE THE TRANSFUSION  Who gives blood for transfusions?  Healthy volunteers who are fully evaluated to make sure their blood is safe. This is blood bank blood. Transfusion therapy is the safest it has ever been in the practice of medicine. Before blood is taken from a donor, a complete history is taken to make sure that person has no history of diseases nor engages in risky social behavior (examples are intravenous drug use or sexual activity with multiple partners). The donor's travel history is screened to minimize risk of transmitting infections, such as malaria. The donated blood is tested for signs of infectious diseases, such as HIV and hepatitis. The blood is then tested to be sure it is compatible with you in order to minimize the chance of a transfusion reaction. If you or a relative donates blood, this is often done in anticipation of surgery and is not appropriate for emergency situations. It takes many days to process the donated blood. RISKS AND COMPLICATIONS Although transfusion therapy is very safe and saves many lives, the main dangers of transfusion include:  Getting an infectious disease. Developing a transfusion reaction. This is an allergic reaction to something in the blood you were given. Every precaution is taken to prevent this. The decision to  have a blood transfusion has been considered carefully by your caregiver before blood is given. Blood is not given unless the benefits outweigh the risks. AFTER THE TRANSFUSION Right after receiving a blood transfusion,  you will usually feel much better and more energetic. This is especially true if your red blood cells have gotten low (anemic). The transfusion raises the level of the red blood cells which carry oxygen, and this usually causes an energy increase. The nurse administering the transfusion will monitor you carefully for complications. HOME CARE INSTRUCTIONS  No special instructions are needed after a transfusion. You may find your energy is better. Speak with your caregiver about any limitations on activity for underlying diseases you may have. SEEK MEDICAL CARE IF:  Your condition is not improving after your transfusion. You develop redness or irritation at the intravenous (IV) site. SEEK IMMEDIATE MEDICAL CARE IF:  Any of the following symptoms occur over the next 12 hours: Shaking chills. You have a temperature by mouth above 102 F (38.9 C), not controlled by medicine. Chest, back, or muscle pain. People around you feel you are not acting correctly or are confused. Shortness of breath or difficulty breathing. Dizziness and fainting. You get a rash or develop hives. You have a decrease in urine output. Your urine turns a dark color or changes to pink, red, or brown. Any of the following symptoms occur over the next 10 days: You have a temperature by mouth above 102 F (38.9 C), not controlled by medicine. Shortness of breath. Weakness after normal activity. The white part of the eye turns yellow (jaundice). You have a decrease in the amount of urine or are urinating less often. Your urine turns a dark color or changes to pink, red, or brown. Document Released: 06/25/2000 Document Revised: 09/20/2011 Document Reviewed: 02/12/2008 [facility] Patient Information 2014  [place], [place].  _______________________________________________________________________

## 2021-02-17 ENCOUNTER — Encounter (HOSPITAL_COMMUNITY): Payer: Self-pay

## 2021-02-17 ENCOUNTER — Encounter (HOSPITAL_COMMUNITY)
Admission: RE | Admit: 2021-02-17 | Discharge: 2021-02-17 | Disposition: A | Discharge status: Home / Self Care | Payer: BC Managed Care – PPO | Source: Ambulatory Visit | Attending: Orthopaedic Surgery | Admitting: Orthopaedic Surgery

## 2021-02-17 ENCOUNTER — Other Ambulatory Visit: Payer: Self-pay

## 2021-02-17 DIAGNOSIS — Z01812 Encounter for preprocedural laboratory examination: Secondary | ICD-10-CM | POA: Insufficient documentation

## 2021-02-17 LAB — CBC
HCT: 38.4 % (ref 36.0–46.0)
Hemoglobin: 13 g/dL (ref 12.0–15.0)
MCH: 30.7 pg (ref 26.0–34.0)
MCHC: 33.9 g/dL (ref 30.0–36.0)
MCV: 90.6 fL (ref 80.0–100.0)
Platelets: 253 10*3/uL (ref 150–400)
RBC: 4.24 MIL/uL (ref 3.87–5.11)
RDW: 12.3 % (ref 11.5–15.5)
WBC: 6.7 10*3/uL (ref 4.0–10.5)
nRBC: 0 % (ref 0.0–0.2)

## 2021-02-17 LAB — SURGICAL PCR SCREEN
MRSA, PCR: NEGATIVE
Staphylococcus aureus: POSITIVE — AB

## 2021-02-25 ENCOUNTER — Other Ambulatory Visit: Payer: Self-pay | Admitting: Orthopaedic Surgery

## 2021-02-25 DIAGNOSIS — U071 COVID-19: Secondary | ICD-10-CM

## 2021-02-25 HISTORY — DX: COVID-19: U07.1

## 2021-02-25 LAB — SARS CORONAVIRUS 2 (TAT 6-24 HRS): SARS Coronavirus 2: POSITIVE — AB

## 2021-02-25 NOTE — Progress Notes (Signed)
[facility] OR made aware of this pt's positive covid result from [place] on 02/25/21.

## 2021-02-27 LAB — TYPE AND SCREEN
ABO/RH(D): B POS
Antibody Screen: NEGATIVE

## 2021-03-04 ENCOUNTER — Encounter (HOSPITAL_COMMUNITY): Payer: Self-pay

## 2021-03-05 NOTE — Progress Notes (Signed)
Spoke to patient and gave her updated arrival time, NPO status for surgery 03-19-21.  No change in medical history except for +Covid which is documented in Epic.  Patient will call back if questions.

## 2021-03-09 ENCOUNTER — Encounter (HOSPITAL_COMMUNITY): Admission: RE | Admit: 2021-03-09 | Payer: BC Managed Care – PPO | Source: Ambulatory Visit

## 2021-03-12 ENCOUNTER — Encounter: Payer: BC Managed Care – PPO | Admitting: Orthopaedic Surgery

## 2021-03-18 NOTE — H&P (Signed)
TOTAL HIP ADMISSION H&P  Patient is admitted for right total hip arthroplasty.  Subjective:  Chief Complaint: right hip pain  HPI: Meghan Martinez, 65 y.o. female, has a history of pain and functional disability in the right hip(s) due to arthritis and patient has failed non-surgical conservative treatments for greater than 12 weeks to include NSAID's and/or analgesics, flexibility and strengthening excercises, weight reduction as appropriate, and activity modification.  Onset of symptoms was gradual starting 3 years ago with gradually worsening course since that time.The patient noted no past surgery on the right hip(s).  Patient currently rates pain in the right hip at 10 out of 10 with activity. Patient has night pain, worsening of pain with activity and weight bearing, trendelenberg gait, pain that interfers with activities of daily living, and pain with passive range of motion. Patient has evidence of subchondral cysts, subchondral sclerosis, periarticular osteophytes, and joint space narrowing by imaging studies. This condition presents safety issues increasing the risk of falls.  There is no current active infection.  Patient Active Problem List   Diagnosis Date Noted   Osteopenia 02/04/2021   Osteoarthritis of right hip 12/10/2020   Obesity (BMI 30-39.9) 12/26/2019   Mixed stress and urge urinary incontinence 09/15/2017   Benign colon polyp 03/17/2016   Family history of colon cancer 08/22/2014   Family history of premature CAD 08/22/2014   Past Medical History:  Diagnosis Date   COVID-19 02/25/2021   History of chickenpox    Obesity (BMI 30-39.9) 12/26/2019    Past Surgical History:  Procedure Laterality Date   ABDOMINAL HYSTERECTOMY     CARPAL TUNNEL RELEASE Left    REPLACEMENT TOTAL KNEE BILATERAL      No current facility-administered medications for this encounter.   Current Outpatient Medications  Medication Sig Dispense Refill Last Dose   METAMUCIL FIBER PO Take 2  Scoops by mouth daily.      No Known Allergies  Social History   Tobacco Use   Smoking status: Former    Types: Cigars   Smokeless tobacco: Never  Substance Use Topics   Alcohol use: Yes    Comment: occasional    Family History  Problem Relation Age of Onset   Cancer Mother        Colon   Lung disease Father    Cancer Sister        Lung   Cancer Brother        Colon     Review of Systems  Musculoskeletal:  Positive for gait problem.  All other systems reviewed and are negative.  Objective:  Physical Exam Vitals reviewed.  Constitutional:      Appearance: Normal appearance.  HENT:     Head: Normocephalic and atraumatic.  Eyes:     Extraocular Movements: Extraocular movements intact.     Pupils: Pupils are equal, round, and reactive to light.  Cardiovascular:     Rate and Rhythm: Normal rate and regular rhythm.  Pulmonary:     Effort: Pulmonary effort is normal.     Breath sounds: Normal breath sounds.  Abdominal:     Palpations: Abdomen is soft.  Musculoskeletal:     Cervical back: Normal range of motion and neck supple.     Right hip: Tenderness and bony tenderness present. Decreased range of motion. Decreased strength.  Neurological:     Mental Status: She is alert and oriented to person, place, and time.  Psychiatric:        Behavior: Behavior normal.  Vital signs in last 24 hours:    Labs:   Estimated body mass index is 35.12 kg/m as calculated from the following:   Height as of 02/17/21: 5\' 9"  (1.753 m).   Weight as of 02/17/21: 107.9 kg.   Imaging Review Plain radiographs demonstrate severe degenerative joint disease of the right hip(s). The bone quality appears to be good for age and reported activity level.      Assessment/Plan:  End stage arthritis, right hip(s)  The patient history, physical examination, clinical judgement of the provider and imaging studies are consistent with end stage degenerative joint disease of the right  hip(s) and total hip arthroplasty is deemed medically necessary. The treatment options including medical management, injection therapy, arthroscopy and arthroplasty were discussed at length. The risks and benefits of total hip arthroplasty were presented and reviewed. The risks due to aseptic loosening, infection, stiffness, dislocation/subluxation,  thromboembolic complications and other imponderables were discussed.  The patient acknowledged the explanation, agreed to proceed with the plan and consent was signed. Patient is being admitted for inpatient treatment for surgery, pain control, PT, OT, prophylactic antibiotics, VTE prophylaxis, progressive ambulation and ADL's and discharge planning.The patient is planning to be discharged home with home health services

## 2021-03-19 ENCOUNTER — Observation Stay (HOSPITAL_COMMUNITY): Payer: BC Managed Care – PPO

## 2021-03-19 ENCOUNTER — Ambulatory Visit (HOSPITAL_COMMUNITY): Payer: BC Managed Care – PPO | Admitting: Anesthesiology

## 2021-03-19 ENCOUNTER — Ambulatory Visit (HOSPITAL_COMMUNITY): Payer: BC Managed Care – PPO

## 2021-03-19 ENCOUNTER — Observation Stay (HOSPITAL_COMMUNITY)
Admission: RE | Admit: 2021-03-19 | Discharge: 2021-03-20 | Disposition: A | Discharge status: Home / Self Care | Payer: BC Managed Care – PPO | Attending: Orthopaedic Surgery | Admitting: Orthopaedic Surgery

## 2021-03-19 ENCOUNTER — Encounter (HOSPITAL_COMMUNITY)
Admission: RE | Disposition: A | Discharge status: Home / Self Care | Payer: Self-pay | Source: Home / Self Care | Attending: Orthopaedic Surgery

## 2021-03-19 ENCOUNTER — Encounter (HOSPITAL_COMMUNITY): Payer: Self-pay | Admitting: Orthopaedic Surgery

## 2021-03-19 ENCOUNTER — Other Ambulatory Visit: Payer: Self-pay

## 2021-03-19 DIAGNOSIS — Z8616 Personal history of COVID-19: Secondary | ICD-10-CM | POA: Insufficient documentation

## 2021-03-19 DIAGNOSIS — Z96653 Presence of artificial knee joint, bilateral: Secondary | ICD-10-CM | POA: Insufficient documentation

## 2021-03-19 DIAGNOSIS — Z87891 Personal history of nicotine dependence: Secondary | ICD-10-CM | POA: Insufficient documentation

## 2021-03-19 DIAGNOSIS — Z96641 Presence of right artificial hip joint: Secondary | ICD-10-CM

## 2021-03-19 DIAGNOSIS — M1611 Unilateral primary osteoarthritis, right hip: Secondary | ICD-10-CM | POA: Diagnosis present

## 2021-03-19 HISTORY — PX: TOTAL HIP ARTHROPLASTY: SHX124

## 2021-03-19 LAB — CBC
HCT: 32.9 % — ABNORMAL LOW (ref 36.0–46.0)
Hemoglobin: 10.8 g/dL — ABNORMAL LOW (ref 12.0–15.0)
MCH: 29.6 pg (ref 26.0–34.0)
MCHC: 32.8 g/dL (ref 30.0–36.0)
MCV: 90.1 fL (ref 80.0–100.0)
Platelets: 196 10*3/uL (ref 150–400)
RBC: 3.65 MIL/uL — ABNORMAL LOW (ref 3.87–5.11)
RDW: 12.5 % (ref 11.5–15.5)
WBC: 5.4 10*3/uL (ref 4.0–10.5)
nRBC: 0 % (ref 0.0–0.2)

## 2021-03-19 LAB — BASIC METABOLIC PANEL
Anion gap: 8 (ref 5–15)
BUN: 20 mg/dL (ref 8–23)
CO2: 21 mmol/L — ABNORMAL LOW (ref 22–32)
Calcium: 9.4 mg/dL (ref 8.9–10.3)
Chloride: 111 mmol/L (ref 98–111)
Creatinine, Ser: 0.69 mg/dL (ref 0.44–1.00)
GFR, Estimated: >60 mL/min (ref >60)
Glucose, Bld: 103 mg/dL — ABNORMAL HIGH (ref 70–99)
Potassium: 4.6 mmol/L (ref 3.5–5.1)
Sodium: 140 mmol/L (ref 135–145)

## 2021-03-19 LAB — TYPE AND SCREEN
ABO/RH(D): B POS
Antibody Screen: NEGATIVE

## 2021-03-19 SURGERY — ARTHROPLASTY, HIP, TOTAL, ANTERIOR APPROACH
Anesthesia: Spinal | Site: Hip | Laterality: Right

## 2021-03-19 MED ORDER — CHLORHEXIDINE GLUCONATE 0.12 % MT SOLN
15.0000 mL | Freq: Once | OROMUCOSAL | Status: AC
  Administered 2021-03-19: 15 mL via OROMUCOSAL

## 2021-03-19 MED ORDER — PANTOPRAZOLE SODIUM 40 MG PO TBEC
40.0000 mg | DELAYED_RELEASE_TABLET | Freq: Every day | ORAL | Status: DC
  Administered 2021-03-19 – 2021-03-20 (×2): 40 mg via ORAL
  Filled 2021-03-19 (×2): qty 1

## 2021-03-19 MED ORDER — ONDANSETRON HCL 4 MG/2ML IJ SOLN: 4.0000 mg | Freq: Four times a day (QID) | INTRAMUSCULAR | Status: DC | PRN

## 2021-03-19 MED ORDER — METOCLOPRAMIDE HCL 5 MG/ML IJ SOLN: 5.0000 mg | Freq: Three times a day (TID) | INTRAMUSCULAR | Status: DC | PRN

## 2021-03-19 MED ORDER — MIDAZOLAM HCL 2 MG/2ML IJ SOLN
INTRAMUSCULAR | Status: AC
  Filled 2021-03-19: qty 2

## 2021-03-19 MED ORDER — CEFAZOLIN SODIUM-DEXTROSE 1-4 GM/50ML-% IV SOLN
1.0000 g | Freq: Four times a day (QID) | INTRAVENOUS | Status: AC
Start: 2021-03-19 — End: 2021-03-19
  Administered 2021-03-19 (×2): 1 g via INTRAVENOUS
  Filled 2021-03-19: qty 50

## 2021-03-19 MED ORDER — HYDROMORPHONE HCL 1 MG/ML IJ SOLN: 0.5000 mg | INTRAMUSCULAR | Status: DC | PRN

## 2021-03-19 MED ORDER — LACTATED RINGERS IV SOLN
INTRAVENOUS | Status: DC
Start: 2021-03-19 — End: 2021-03-19

## 2021-03-19 MED ORDER — PROPOFOL 10 MG/ML IV BOLUS
INTRAVENOUS | Status: AC
  Filled 2021-03-19: qty 20

## 2021-03-19 MED ORDER — PHENOL 1.4 % MT LIQD: 1.0000 | OROMUCOSAL | Status: DC | PRN

## 2021-03-19 MED ORDER — MENTHOL 3 MG MT LOZG: 1.0000 | LOZENGE | OROMUCOSAL | Status: DC | PRN

## 2021-03-19 MED ORDER — TRANEXAMIC ACID-NACL 1000-0.7 MG/100ML-% IV SOLN
INTRAVENOUS | Status: AC
  Filled 2021-03-19: qty 100

## 2021-03-19 MED ORDER — CEFAZOLIN SODIUM-DEXTROSE 2-4 GM/100ML-% IV SOLN
2.0000 g | INTRAVENOUS | Status: AC
  Administered 2021-03-19: 2 g via INTRAVENOUS
  Filled 2021-03-19: qty 100

## 2021-03-19 MED ORDER — ALUM & MAG HYDROXIDE-SIMETH 200-200-20 MG/5ML PO SUSP: 30.0000 mL | ORAL | Status: DC | PRN

## 2021-03-19 MED ORDER — METHOCARBAMOL 500 MG PO TABS
500.0000 mg | ORAL_TABLET | Freq: Four times a day (QID) | ORAL | Status: DC | PRN
  Administered 2021-03-19 – 2021-03-20 (×3): 500 mg via ORAL
  Filled 2021-03-19 (×3): qty 1

## 2021-03-19 MED ORDER — HYDROMORPHONE HCL 1 MG/ML IJ SOLN: 0.2500 mg | INTRAMUSCULAR | Status: DC | PRN

## 2021-03-19 MED ORDER — POLYETHYLENE GLYCOL 3350 17 G PO PACK: 17.0000 g | PACK | Freq: Every day | ORAL | Status: DC | PRN

## 2021-03-19 MED ORDER — ACETAMINOPHEN 325 MG PO TABS: 325.0000 mg | ORAL_TABLET | Freq: Four times a day (QID) | ORAL | Status: DC | PRN

## 2021-03-19 MED ORDER — DOCUSATE SODIUM 100 MG PO CAPS
100.0000 mg | ORAL_CAPSULE | Freq: Two times a day (BID) | ORAL | Status: DC
  Administered 2021-03-19 – 2021-03-20 (×3): 100 mg via ORAL
  Filled 2021-03-19 (×3): qty 1

## 2021-03-19 MED ORDER — DEXAMETHASONE SODIUM PHOSPHATE 10 MG/ML IJ SOLN
INTRAMUSCULAR | Status: AC
  Filled 2021-03-19: qty 1

## 2021-03-19 MED ORDER — STERILE WATER FOR IRRIGATION IR SOLN
Status: DC | PRN
  Administered 2021-03-19: 2000 mL

## 2021-03-19 MED ORDER — PROPOFOL 500 MG/50ML IV EMUL
INTRAVENOUS | Status: AC
  Filled 2021-03-19: qty 50

## 2021-03-19 MED ORDER — ONDANSETRON HCL 4 MG PO TABS: 4.0000 mg | ORAL_TABLET | Freq: Four times a day (QID) | ORAL | Status: DC | PRN

## 2021-03-19 MED ORDER — BUPIVACAINE IN DEXTROSE 0.75-8.25 % IT SOLN
INTRATHECAL | Status: DC | PRN
  Administered 2021-03-19: 1.8 mL via INTRATHECAL

## 2021-03-19 MED ORDER — SODIUM CHLORIDE 0.9 % IV SOLN: INTRAVENOUS | Status: DC

## 2021-03-19 MED ORDER — 0.9 % SODIUM CHLORIDE (POUR BTL) OPTIME
TOPICAL | Status: DC | PRN
  Administered 2021-03-19: 1000 mL

## 2021-03-19 MED ORDER — LIDOCAINE 2% (20 MG/ML) 5 ML SYRINGE
INTRAMUSCULAR | Status: AC
  Filled 2021-03-19: qty 5

## 2021-03-19 MED ORDER — MIDAZOLAM HCL 5 MG/5ML IJ SOLN
INTRAMUSCULAR | Status: DC | PRN
  Administered 2021-03-19: 2 mg via INTRAVENOUS

## 2021-03-19 MED ORDER — TRANEXAMIC ACID-NACL 1000-0.7 MG/100ML-% IV SOLN
1000.0000 mg | INTRAVENOUS | Status: AC
  Administered 2021-03-19: 1000 mg via INTRAVENOUS

## 2021-03-19 MED ORDER — OXYCODONE HCL 5 MG PO TABS: 5.0000 mg | ORAL_TABLET | Freq: Once | ORAL | Status: DC | PRN

## 2021-03-19 MED ORDER — ONDANSETRON HCL 4 MG/2ML IJ SOLN
INTRAMUSCULAR | Status: AC
  Filled 2021-03-19: qty 2

## 2021-03-19 MED ORDER — OXYCODONE HCL 5 MG PO TABS
10.0000 mg | ORAL_TABLET | ORAL | Status: DC | PRN
  Administered 2021-03-19: 10 mg via ORAL
  Filled 2021-03-19 (×2): qty 2

## 2021-03-19 MED ORDER — DEXAMETHASONE SODIUM PHOSPHATE 10 MG/ML IJ SOLN
INTRAMUSCULAR | Status: DC | PRN
  Administered 2021-03-19: 10 mg via INTRAVENOUS

## 2021-03-19 MED ORDER — ORAL CARE MOUTH RINSE: 15.0000 mL | Freq: Once | OROMUCOSAL | Status: AC

## 2021-03-19 MED ORDER — FENTANYL CITRATE (PF) 100 MCG/2ML IJ SOLN
INTRAMUSCULAR | Status: DC | PRN
  Administered 2021-03-19: 100 ug via INTRAVENOUS

## 2021-03-19 MED ORDER — ONDANSETRON HCL 4 MG/2ML IJ SOLN
INTRAMUSCULAR | Status: DC | PRN
  Administered 2021-03-19: 4 mg via INTRAVENOUS

## 2021-03-19 MED ORDER — METOCLOPRAMIDE HCL 5 MG PO TABS: 5.0000 mg | ORAL_TABLET | Freq: Three times a day (TID) | ORAL | Status: DC | PRN

## 2021-03-19 MED ORDER — OXYCODONE HCL 5 MG PO TABS
5.0000 mg | ORAL_TABLET | ORAL | Status: DC | PRN
  Administered 2021-03-19 – 2021-03-20 (×4): 10 mg via ORAL
  Filled 2021-03-19 (×2): qty 2

## 2021-03-19 MED ORDER — PROPOFOL 500 MG/50ML IV EMUL
INTRAVENOUS | Status: DC | PRN
  Administered 2021-03-19: 75 ug/kg/min via INTRAVENOUS

## 2021-03-19 MED ORDER — OXYCODONE HCL 5 MG/5ML PO SOLN: 5.0000 mg | Freq: Once | ORAL | Status: DC | PRN

## 2021-03-19 MED ORDER — DIPHENHYDRAMINE HCL 12.5 MG/5ML PO ELIX: 12.5000 mg | ORAL_SOLUTION | ORAL | Status: DC | PRN

## 2021-03-19 MED ORDER — PROPOFOL 10 MG/ML IV BOLUS
INTRAVENOUS | Status: DC | PRN
  Administered 2021-03-19: 30 mg via INTRAVENOUS

## 2021-03-19 MED ORDER — PHENYLEPHRINE HCL-NACL 20-0.9 MG/250ML-% IV SOLN
INTRAVENOUS | Status: AC
  Filled 2021-03-19: qty 250

## 2021-03-19 MED ORDER — METHOCARBAMOL 1000 MG/10ML IJ SOLN
500.0000 mg | Freq: Four times a day (QID) | INTRAVENOUS | Status: DC | PRN
  Filled 2021-03-19: qty 5

## 2021-03-19 MED ORDER — PROMETHAZINE HCL 25 MG/ML IJ SOLN: 6.2500 mg | INTRAMUSCULAR | Status: DC | PRN

## 2021-03-19 MED ORDER — POVIDONE-IODINE 10 % EX SWAB
2.0000 "application " | Freq: Once | CUTANEOUS | Status: AC
  Administered 2021-03-19: 2 via TOPICAL

## 2021-03-19 MED ORDER — FENTANYL CITRATE (PF) 100 MCG/2ML IJ SOLN
INTRAMUSCULAR | Status: AC
  Filled 2021-03-19: qty 2

## 2021-03-19 MED ORDER — SODIUM CHLORIDE 0.9 % IR SOLN
Status: DC | PRN
  Administered 2021-03-19: 1000 mL

## 2021-03-19 MED ORDER — ASPIRIN 81 MG PO CHEW
81.0000 mg | CHEWABLE_TABLET | Freq: Two times a day (BID) | ORAL | Status: DC
  Administered 2021-03-19 – 2021-03-20 (×2): 81 mg via ORAL
  Filled 2021-03-19 (×2): qty 1

## 2021-03-19 SURGICAL SUPPLY — 45 items
APL SKNCLS STERI-STRIP NONHPOA (GAUZE/BANDAGES/DRESSINGS)
BAG COUNTER SPONGE SURGICOUNT (BAG) ×2 IMPLANT
BAG SPEC THK2 15X12 ZIP CLS (MISCELLANEOUS)
BAG SPNG CNTER NS LX DISP (BAG) ×1
BAG ZIPLOCK 12X15 (MISCELLANEOUS) IMPLANT
BENZOIN TINCTURE PRP APPL 2/3 (GAUZE/BANDAGES/DRESSINGS) IMPLANT
BLADE SAW SGTL 18X1.27X75 (BLADE) ×2 IMPLANT
COLLAR OFFSET CORAIL SZ 12 HIP (Stem) IMPLANT
CORAIL OFFSET COLLAR SZ 12 HIP (Stem) ×2 IMPLANT
COVER PERINEAL POST (MISCELLANEOUS) ×2 IMPLANT
COVER SURGICAL LIGHT HANDLE (MISCELLANEOUS) ×2 IMPLANT
CUP ACET PNNCL SECTR W/GRIP 56 (Hips) IMPLANT
DRAPE FOOT SWITCH (DRAPES) ×2 IMPLANT
DRAPE STERI IOBAN 125X83 (DRAPES) ×2 IMPLANT
DRAPE U-SHAPE 47X51 STRL (DRAPES) ×4 IMPLANT
DRESSING AQUACEL AG SP 3.5X10 (GAUZE/BANDAGES/DRESSINGS) IMPLANT
DRSG AQUACEL AG ADV 3.5X10 (GAUZE/BANDAGES/DRESSINGS) ×2 IMPLANT
DRSG AQUACEL AG SP 3.5X10 (GAUZE/BANDAGES/DRESSINGS) ×2
DURAPREP 26ML APPLICATOR (WOUND CARE) ×2 IMPLANT
ELECT REM PT RETURN 15FT ADLT (MISCELLANEOUS) ×2 IMPLANT
GAUZE XEROFORM 1X8 LF (GAUZE/BANDAGES/DRESSINGS) ×2 IMPLANT
GLOVE SRG 8 PF TXTR STRL LF DI (GLOVE) ×2 IMPLANT
GLOVE SURG ENC MOIS LTX SZ7.5 (GLOVE) ×2 IMPLANT
GLOVE SURG NEOPR MICRO LF SZ8 (GLOVE) ×2 IMPLANT
GLOVE SURG UNDER POLY LF SZ8 (GLOVE) ×4
GOWN STRL REUS W/TWL XL LVL3 (GOWN DISPOSABLE) ×4 IMPLANT
HANDPIECE INTERPULSE COAX TIP (DISPOSABLE) ×2
HEAD CERAMIC 36 PLUS5 (Hips) ×1 IMPLANT
HOLDER FOLEY CATH W/STRAP (MISCELLANEOUS) ×2 IMPLANT
KIT TURNOVER KIT A (KITS) ×2 IMPLANT
LINER NEUTRAL 52MMX36MMX56N (Liner) ×1 IMPLANT
PACK ANTERIOR HIP CUSTOM (KITS) ×2 IMPLANT
PENCIL SMOKE EVACUATOR (MISCELLANEOUS) IMPLANT
PINN SECTOR W/GRIP ACE CUP 56 (Hips) ×2 IMPLANT
SET HNDPC FAN SPRY TIP SCT (DISPOSABLE) ×1 IMPLANT
STAPLER VISISTAT 35W (STAPLE) ×1 IMPLANT
STRIP CLOSURE SKIN 1/2X4 (GAUZE/BANDAGES/DRESSINGS) IMPLANT
SUT ETHIBOND NAB CT1 #1 30IN (SUTURE) ×2 IMPLANT
SUT ETHILON 2 0 PS N (SUTURE) IMPLANT
SUT MNCRL AB 4-0 PS2 18 (SUTURE) IMPLANT
SUT VIC AB 0 CT1 36 (SUTURE) ×2 IMPLANT
SUT VIC AB 1 CT1 36 (SUTURE) ×2 IMPLANT
SUT VIC AB 2-0 CT1 27 (SUTURE) ×4
SUT VIC AB 2-0 CT1 TAPERPNT 27 (SUTURE) ×2 IMPLANT
TRAY FOLEY MTR SLVR 16FR STAT (SET/KITS/TRAYS/PACK) ×1 IMPLANT

## 2021-03-19 NOTE — Op Note (Signed)
NAME: Meghan Martinez, [place] MEDICAL RECORD NO: [id number] ACCOUNT NO: [id number] DATE OF BIRTH: January 11, 1956 FACILITY: [facility] LOCATION: [facility] PHYSICIAN: Vanita Panda. Magnus Ivan, MD  Operative Report   DATE OF PROCEDURE: 03/19/2021   PREOPERATIVE DIAGNOSIS:  Primary osteoarthritis and degenerative joint disease, right hip.  POSTOPERATIVE DIAGNOSIS:  Primary osteoarthritis and degenerative joint disease, right hip.  PROCEDURE:  Right total hip arthroplasty through direct anterior approach.  IMPLANTS:  DePuy sector Gription acetabular component, size 56, size 36+0 neutral polyethylene liner, size 12 Corail femoral component with high offset, size 36+5 ceramic hip ball.  SURGEON:  Doneen Poisson, M.D.   ASSISTANT: Richardean Canal, PA-C.  ANESTHESIA:  Spinal.  ANTIBIOTICS:  2 g IV Ancef.  ESTIMATED BLOOD LOSS:  150 mL.  COMPLICATIONS:  None.  INDICATIONS:  The patient is a very pleasant 65 year old female with well documented severe osteoarthritis and degenerative joint disease of her right hip.  She has tried and failed all forms of conservative treatment.  Her right hip pain is daily and at  this point, it is detrimentally affecting her mobility, her quality of life and her activities of daily living to the point she does wish to proceed with a total hip arthroplasty on the right side.  We talked in length in detail about the risk of acute  blood loss anemia, nerve or vessel injury, fracture, infection, implant failure, dislocation, DVT and skin and soft tissue issues as well as leg length discrepancy.  We talked about our goals being decreased pain, improve mobility and overall improve  quality of life.  DESCRIPTION OF PROCEDURE:  After informed consent was obtained, appropriate right hip was marked.  She was brought to the operating room and sat up on a stretcher where spinal anesthesia was obtained.  She was then laid in supine position on the  stretcher.  Foley catheter was placed and  traction boots were placed on both her feet.  Next, she was placed supine on the Hana fracture table, the perineal post in place and both legs in line skeletal traction device and no traction applied.  Her right  operative hip was prepped and draped in DuraPrep and sterile drapes.  A timeout was called.  She was identified correct patient, correct right hip.  I then made an incision just inferior and posterior to the anterior superior iliac spine and carried this  obliquely down the leg.  We dissected down tensor fascia lata muscle.  Tensor fascia was then divided longitudinally to proceed with direct anterior approach to the hip.  We identified and cauterized circumflex vessels, identified the hip capsule,  opened the hip capsule in L-type format finding a moderate joint effusion.  There was certainly periarticular osteophytes around the lateral femoral head and neck.  We placed curved retractors around the medial and lateral femoral neck and made our  femoral neck cut with oscillating saw just proximal to the lesser trochanter and completed this with an osteotome.  I placed a corkscrew guide in the femoral head and removed the femoral head in its entirety and found a wide area devoid of cartilage.  We  removed periarticular osteophytes around the acetabular rim as well as remnants of the acetabular labrum and other debris.  I placed a bent Hohmann over the medial acetabular rim and then began reaming under direct visualization from a size 43 reamer in  a stepwise increments going up to a size 55 reamer.  With all reamers placed under direct visualization, the last reamer was placed under  direct fluoroscopy, so we could obtain our depth of reaming, our inclination and anteversion.  I then placed a real  DePuy sector Gription acetabular component, size 56 and we went with a 36+0 neutral polyethylene liner.  Attention was then turned to the femur. With the leg externally rotated to 120 degrees and extended and  adducted, we were able to place a Mueller  retractor medially and a Hohmann retractor behind the greater trochanter released lateral joint capsule and used a box cutting osteotome to enter femoral canal and a rongeur to lateralize, then began broaching using the Corail broaching system from a  size 8 going up to size 12.  With a size 12 in place, we trialed a standard offset femoral neck and a 36+15 trial hip ball.  We brought the leg back over and up and with traction and internal rotation reducing the pelvis.  We felt like we needed just a  little more leg length.  It was stable on exam.  We then dislocated the hip and removed the trial components.  We placed the real Corail femoral component with high offset size 12 and the real 36+5 ceramic hip ball and again reduced this in the  acetabulum.  We assessed mechanically and radiographically.  We were pleased with leg length, offset, range of motion and stability.  We then irrigated the soft tissue with normal saline solution using pulsatile lavage.  We closed the joint capsule with  interrupted #1 Ethibond suture followed by #1 Vicryl to close the tensor fascia.  0 Vicryl was used to close the deep tissue and 2-0 Vicryl was used to close subcutaneous tissue.  The skin was closed with staples.  An Aquacel dressing was applied.  She  was taken off the Hana table and taken to recovery room in stable condition with all final counts being correct and no complications noted.  Of note, Rexene Edison, PA-C did assist during the entire case and assistance was crucial for facilitating every  aspect of this case.     Xaver.Mink D: 03/19/2021 8:35:22 am T: 03/19/2021 9:10:00 am  JOB: [id number]/ [id number]

## 2021-03-19 NOTE — Transfer of Care (Signed)
Immediate Anesthesia Transfer of Care Note  Patient: Meghan Martinez  Procedure(s) Performed: RIGHT HIP ARTHROPLASTY ANTERIOR APPROACH (Right: Hip)  Patient Location: PACU  Anesthesia Type:Spinal  Level of Consciousness: awake, alert  and oriented  Airway & Oxygen Therapy: Patient Spontanous Breathing and Patient connected to face mask oxygen  Post-op Assessment: Report given to RN and Post -op Vital signs reviewed and stable  Post vital signs: Reviewed and stable  Last Vitals:  Vitals Value Taken Time  BP 111/89 03/19/21 0856  Temp    Pulse 52 03/19/21 0859  Resp 15 03/19/21 0859  SpO2 99 % 03/19/21 0859  Vitals shown include unvalidated device data.  Last Pain:  Vitals:   03/19/21 0548  TempSrc:   PainSc: 2       Patients Stated Pain Goal: 2 (03/19/21 0548)  Complications: No notable events documented.

## 2021-03-19 NOTE — Evaluation (Signed)
Physical Therapy Evaluation Patient Details Name: Meghan Martinez MRN: [id number] DOB: 1955-07-29 Today's Date: 03/19/2021   History of Present Illness  Pt s/p R THR and with hx of bil TKR  Clinical Impression  Pt s/p R THR and presents with decreased R LE strength/ROM, post op pain and dizziness with attempts to mobilize limiting functional mobility.  Pt should progress to dc home with assist of friends.    Follow Up Recommendations Home health PT    Equipment Recommendations   (Pt considering 3n1)    Recommendations for Other Services       Precautions / Restrictions Precautions Precautions: Fall Restrictions Weight Bearing Restrictions: No Other Position/Activity Restrictions: WBAT      Mobility  Bed Mobility Overal bed mobility: Needs Assistance Bed Mobility: Supine to Sit     Supine to sit: Min assist;Mod assist     General bed mobility comments: Increased time with cues for sequence and use of L LE to self assist    Transfers Overall transfer level: Needs assistance Equipment used: Rolling walker (2 wheeled) Transfers: Sit to/from [employer] Sit to Stand: Min assist;Mod assist;+2 physical assistance;+2 safety/equipment;From elevated surface Stand pivot transfers: Min assist;Mod assist;+2 safety/equipment       General transfer comment: cues for LE management and use of UEs to self assist  Ambulation/Gait             General Gait Details: Stand pvt only 2* pt c/o increasing dizziness and pt becoming diaphoretic.  BP 111/65  Stairs            Wheelchair Mobility    Modified Rankin (Stroke Patients Only)       Balance Overall balance assessment: Needs assistance Sitting-balance support: No upper extremity supported;Feet supported Sitting balance-Leahy Scale: Good     Standing balance support: Bilateral upper extremity supported Standing balance-Leahy Scale: Poor                               Pertinent  Vitals/Pain Pain Assessment: 0-10 Pain Score: 5  Pain Location: R hip Pain Descriptors / Indicators: Aching;Sore Pain Intervention(s): Limited activity within patient's tolerance;Monitored during session;Premedicated before session;Ice applied    Home Living Family/patient expects to be discharged to:: Private residence Living Arrangements: Alone Available Help at Discharge: Friend(s);Available 24 hours/day Type of Home: House Home Access: Stairs to enter Entrance Stairs-Rails: Doctor, general practice of Steps: 4 Home Layout: One level Home Equipment: Walker - 2 wheels;Cane - single point      Prior Function Level of Independence: Independent               Hand Dominance        Extremity/Trunk Assessment   Upper Extremity Assessment Upper Extremity Assessment: Overall WFL for tasks assessed    Lower Extremity Assessment Lower Extremity Assessment: RLE deficits/detail    Cervical / Trunk Assessment Cervical / Trunk Assessment: Normal  Communication   Communication: No difficulties  Cognition Arousal/Alertness: Awake/alert Behavior During Therapy: WFL for tasks assessed/performed Overall Cognitive Status: Within Functional Limits for tasks assessed                                        General Comments      Exercises Total Joint Exercises Ankle Circles/Pumps: AROM;Both;15 reps;Supine   Assessment/Plan    PT Assessment Patient needs continued PT  services  PT Problem List Decreased strength;Decreased range of motion;Decreased activity tolerance;Decreased balance;Decreased mobility;Decreased knowledge of use of DME;Obesity;Pain       PT Treatment Interventions DME instruction;Gait training;Stair training;Functional mobility training;Therapeutic activities;Therapeutic exercise;Patient/family education    PT Goals (Current goals can be found in the Care Plan section)  Acute Rehab PT Goals Patient Stated Goal: Regain IND PT Goal  Formulation: With patient Time For Goal Achievement: 03/26/21 Potential to Achieve Goals: Good    Frequency 7X/week   Barriers to discharge        Co-evaluation               AM-PAC PT "6 Clicks" Mobility  Outcome Measure Help needed turning from your back to your side while in a flat bed without using bedrails?: A Lot Help needed moving from lying on your back to sitting on the side of a flat bed without using bedrails?: A Lot Help needed moving to and from a bed to a chair (including a wheelchair)?: A Lot Help needed standing up from a chair using your arms (e.g., wheelchair or bedside chair)?: A Lot Help needed to walk in hospital room?: A Lot Help needed climbing 3-5 steps with a railing? : Total 6 Click Score: 11    End of Session Equipment Utilized During Treatment: Gait belt Activity Tolerance: Other (comment);Patient limited by fatigue (dizziness) Patient left: in chair;with call bell/phone within reach Nurse Communication: Mobility status PT Visit Diagnosis: Unsteadiness on feet (R26.81);Difficulty in walking, not elsewhere classified (R26.2);Pain Pain - Right/Left: Right Pain - part of body: Hip    Time: 2863-8177 PT Time Calculation (min) (ACUTE ONLY): 30 min   Charges:   PT Evaluation $PT Eval Low Complexity: 1 Low PT Treatments $Therapeutic Activity: 8-22 mins        Mauro Kaufmann PT Acute Rehabilitation Services Pager [phone] Office [phone]   Willard Madrigal 03/19/2021, 4:51 PM

## 2021-03-19 NOTE — Interval H&P Note (Signed)
History and Physical Interval Note: The patient understands that she is here today for a right total hip replacement to treat her right hip osteoarthritis.  The risks and benefits of surgery have been explained in detail.  There has been no acute or interval change in her medical status.  Please see recent H&P.  Informed consent is obtained and the right hip has been marked.  03/19/2021 7:05 AM  Barry Dienes  has presented today for surgery, with the diagnosis of Osteoarthritis Right Hip.  The various methods of treatment have been discussed with the patient and family. After consideration of risks, benefits and other options for treatment, the patient has consented to  Procedure(s): RIGHT HIP ARTHROPLASTY ANTERIOR APPROACH (Right) as a surgical intervention.  The patient's history has been reviewed, patient examined, no change in status, stable for surgery.  I have reviewed the patient's chart and labs.  Questions were answered to the patient's satisfaction.     Kathryne Hitch

## 2021-03-19 NOTE — Plan of Care (Signed)
  Problem: Clinical Measurements: Goal: Diagnostic test results will improve Outcome: Progressing   Problem: Clinical Measurements: Goal: Respiratory complications will improve Outcome: Progressing   Problem: Clinical Measurements: Goal: Cardiovascular complication will be avoided Outcome: Progressing   Problem: Elimination: Goal: Will not experience complications related to bowel motility Outcome: Progressing   Problem: Skin Integrity: Goal: Risk for impaired skin integrity will decrease Outcome: Progressing   

## 2021-03-19 NOTE — Anesthesia Procedure Notes (Signed)
Spinal  Patient location during procedure: OR Start time: 03/19/2021 7:23 AM End time: 03/19/2021 7:28 AM Reason for block: surgical anesthesia Staffing Performed: anesthesiologist  Anesthesiologist: Lowella Curb, MD Preanesthetic Checklist Completed: patient identified, IV checked, site marked, risks and benefits discussed, surgical consent, monitors and equipment checked, pre-op evaluation and timeout performed Spinal Block Patient position: sitting Prep: DuraPrep Patient monitoring: heart rate, cardiac monitor, continuous pulse ox and blood pressure Approach: midline Location: L3-4 Injection technique: single-shot Needle Needle type: Quincke  Needle gauge: 22 G Needle length: 9 cm Assessment Sensory level: T4 Events: CSF return and second provider Additional Notes 1st attempt by CRNA with 24 g Sprotte.  2nd attempt by anesthesiologist with 22 g Quincke successful.

## 2021-03-19 NOTE — Brief Op Note (Signed)
03/19/2021  8:37 AM  PATIENT:  Meghan Martinez  65 y.o. female  PRE-OPERATIVE DIAGNOSIS:  Osteoarthritis Right Hip  POST-OPERATIVE DIAGNOSIS:  Osteoarthritis Right Hip  PROCEDURE:  Procedure(s): RIGHT HIP ARTHROPLASTY ANTERIOR APPROACH (Right)  SURGEON:  Surgeon(s) and Role:    Kathryne Hitch, MD - Primary  PHYSICIAN ASSISTANT: Rexene Edison, PA-C  ANESTHESIA:   spinal  EBL:  150 mL   COUNTS:  YES  DICTATION: .Other Dictation: Dictation Number [id number]  PLAN OF CARE: Admit for overnight observation  PATIENT DISPOSITION:  PACU - hemodynamically stable.   Delay start of Pharmacological VTE agent (>24hrs) due to surgical blood loss or risk of bleeding: no

## 2021-03-19 NOTE — Anesthesia Postprocedure Evaluation (Signed)
Anesthesia Post Note  Patient: Alissah Redmon Biscardi  Procedure(s) Performed: RIGHT HIP ARTHROPLASTY ANTERIOR APPROACH (Right: Hip)     Patient location during evaluation: PACU Anesthesia Type: Spinal Level of consciousness: awake and alert Pain management: pain level controlled Vital Signs Assessment: post-procedure vital signs reviewed and stable Respiratory status: spontaneous breathing, nonlabored ventilation and respiratory function stable Cardiovascular status: blood pressure returned to baseline and stable Postop Assessment: no apparent nausea or vomiting Anesthetic complications: no   No notable events documented.  Last Vitals:  Vitals:   03/19/21 0938 03/19/21 0945  BP:  (!) 141/81  Pulse: (!) 47 (!) 47  Resp: 17 13  Temp:    SpO2: 99% 97%    Last Pain:  Vitals:   03/19/21 0930  TempSrc:   PainSc: 0-No pain                 Lowella Curb

## 2021-03-19 NOTE — Anesthesia Preprocedure Evaluation (Signed)
Anesthesia Evaluation  Patient identified by MRN, date of birth, ID band Patient awake    Reviewed: Allergy & Precautions, NPO status , Patient's Chart, lab work & pertinent test results  Airway Mallampati: II  TM Distance: >3 FB Neck ROM: Full    Dental no notable dental hx.    Pulmonary neg pulmonary ROS, former smoker,    Pulmonary exam normal breath sounds clear to auscultation       Cardiovascular negative cardio ROS Normal cardiovascular exam Rhythm:Regular Rate:Normal     Neuro/Psych negative neurological ROS  negative psych ROS   GI/Hepatic negative GI ROS, Neg liver ROS,   Endo/Other  negative endocrine ROS  Renal/GU negative Renal ROS  negative genitourinary   Musculoskeletal  (+) Arthritis , Osteoarthritis,    Abdominal (+) + obese,   Peds negative pediatric ROS (+)  Hematology negative hematology ROS (+)   Anesthesia Other Findings   Reproductive/Obstetrics negative OB ROS                             Anesthesia Physical Anesthesia Plan  ASA: 2  Anesthesia Plan: Spinal   Post-op Pain Management:    Induction: Intravenous  PONV Risk Score and Plan: 2 and Ondansetron, Midazolam and Treatment may vary due to age or medical condition  Airway Management Planned: Simple Face Mask  Additional Equipment:   Intra-op Plan:   Post-operative Plan:   Informed Consent: I have reviewed the patients History and Physical, chart, labs and discussed the procedure including the risks, benefits and alternatives for the proposed anesthesia with the patient or authorized representative who has indicated his/her understanding and acceptance.     Dental advisory given  Plan Discussed with: CRNA  Anesthesia Plan Comments:         Anesthesia Quick Evaluation

## 2021-03-20 ENCOUNTER — Other Ambulatory Visit: Payer: Self-pay | Admitting: Orthopedic Surgery

## 2021-03-20 ENCOUNTER — Other Ambulatory Visit: Payer: Self-pay | Admitting: Orthopaedic Surgery

## 2021-03-20 DIAGNOSIS — M1611 Unilateral primary osteoarthritis, right hip: Secondary | ICD-10-CM | POA: Diagnosis not present

## 2021-03-20 LAB — BASIC METABOLIC PANEL
Anion gap: 5 (ref 5–15)
BUN: 22 mg/dL (ref 8–23)
CO2: 25 mmol/L (ref 22–32)
Calcium: 8.3 mg/dL — ABNORMAL LOW (ref 8.9–10.3)
Chloride: 107 mmol/L (ref 98–111)
Creatinine, Ser: 0.87 mg/dL (ref 0.44–1.00)
GFR, Estimated: >60 mL/min (ref >60)
Glucose, Bld: 166 mg/dL — ABNORMAL HIGH (ref 70–99)
Potassium: 4.2 mmol/L (ref 3.5–5.1)
Sodium: 137 mmol/L (ref 135–145)

## 2021-03-20 LAB — CBC
HCT: 29.7 % — ABNORMAL LOW (ref 36.0–46.0)
Hemoglobin: 10 g/dL — ABNORMAL LOW (ref 12.0–15.0)
MCH: 30.1 pg (ref 26.0–34.0)
MCHC: 33.7 g/dL (ref 30.0–36.0)
MCV: 89.5 fL (ref 80.0–100.0)
Platelets: 193 10*3/uL (ref 150–400)
RBC: 3.32 MIL/uL — ABNORMAL LOW (ref 3.87–5.11)
RDW: 12.5 % (ref 11.5–15.5)
WBC: 10.7 10*3/uL — ABNORMAL HIGH (ref 4.0–10.5)
nRBC: 0 % (ref 0.0–0.2)

## 2021-03-20 MED ORDER — OXYCODONE HCL 5 MG PO TABS: 5.0000 mg | ORAL_TABLET | ORAL | 0 refills | Status: DC | PRN

## 2021-03-20 MED ORDER — ASPIRIN 81 MG PO CHEW: 81.0000 mg | CHEWABLE_TABLET | Freq: Two times a day (BID) | ORAL | 0 refills | Status: DC

## 2021-03-20 MED ORDER — METHOCARBAMOL 500 MG PO TABS: 500.0000 mg | ORAL_TABLET | Freq: Four times a day (QID) | ORAL | 1 refills | Status: DC | PRN

## 2021-03-20 NOTE — Discharge Summary (Signed)
Patient ID: Meghan Martinez MRN: [id number] DOB/AGE: 01-24-1956 65 y.o.  Admit date: 03/19/2021 Discharge date: 03/20/2021  Admission Diagnoses:  Principal Problem:   Osteoarthritis of right hip Active Problems:   Status post total replacement of right hip   Discharge Diagnoses:  Same  Past Medical History:  Diagnosis Date   COVID-19 02/25/2021   History of chickenpox    Obesity (BMI 30-39.9) 12/26/2019    Surgeries: Procedure(s): RIGHT HIP ARTHROPLASTY ANTERIOR APPROACH on 03/19/2021   Consultants:   Discharged Condition: Improved  Hospital Course: Meghan Martinez is an 65 y.o. female who was admitted 03/19/2021 for operative treatment ofOsteoarthritis of right hip. Patient has severe unremitting pain that affects sleep, daily activities, and work/hobbies. After pre-op clearance the patient was taken to the operating room on 03/19/2021 and underwent  Procedure(s): RIGHT HIP ARTHROPLASTY ANTERIOR APPROACH.    Patient was given perioperative antibiotics:  Anti-infectives (From admission, onward)    Start     Dose/Rate Route Frequency Ordered Stop   03/19/21 1330  ceFAZolin (ANCEF) IVPB 1 g/50 mL premix        1 g 100 mL/hr over 30 Minutes Intravenous Every 6 hours 03/19/21 1052 03/19/21 1851   03/19/21 0600  ceFAZolin (ANCEF) IVPB 2g/100 mL premix        2 g 200 mL/hr over 30 Minutes Intravenous On call to O.R. 03/19/21 6301 03/19/21 0741        Patient was given sequential compression devices, early ambulation, and chemoprophylaxis to prevent DVT.  Patient benefited maximally from hospital stay and there were no complications.    Recent vital signs: Patient Vitals for the past 24 hrs:  BP Temp Temp src Pulse Resp SpO2  03/20/21 1347 118/63 98.2 F (36.8 C) Oral 65 20 97 %  03/20/21 0935 (!) 117/58 98.3 F (36.8 C) Oral 63 18 98 %  03/20/21 0601 (!) 116/51 98.2 F (36.8 C) -- 71 17 99 %  03/20/21 0154 (!) 141/66 98.1 F (36.7 C) -- 70 17 95 %  03/19/21 2137 (!)  153/56 98.3 F (36.8 C) -- 69 17 97 %     Recent laboratory studies:  Recent Labs    03/19/21 0541 03/19/21 0902 03/20/21 0322  WBC  --  5.4 10.7*  HGB  --  10.8* 10.0*  HCT  --  32.9* 29.7*  PLT  --  196 193  NA 140  --  137  K 4.6  --  4.2  CL 111  --  107  CO2 21*  --  25  BUN 20  --  22  CREATININE 0.69  --  0.87  GLUCOSE 103*  --  166*  CALCIUM 9.4  --  8.3*     Discharge Medications:   Allergies as of 03/20/2021   No Known Allergies      Medication List     TAKE these medications    aspirin 81 MG chewable tablet Chew 1 tablet (81 mg total) by mouth 2 (two) times daily.   METAMUCIL FIBER PO Take 2 Scoops by mouth daily.   methocarbamol 500 MG tablet Commonly known as: ROBAXIN Take 1 tablet (500 mg total) by mouth every 6 (six) hours as needed for muscle spasms.   oxyCODONE 5 MG immediate release tablet Commonly known as: Oxy IR/ROXICODONE Take 1-2 tablets (5-10 mg total) by mouth every 4 (four) hours as needed for moderate pain (pain score 4-6).  Durable Medical Equipment  (From admission, onward)           Start     Ordered   03/19/21 1053  DME 3 n 1  Once        03/19/21 1052   03/19/21 1053  DME Walker rolling  Once       Question Answer Comment  Walker: With 5 Inch Wheels   Patient needs a walker to treat with the following condition Status post total replacement of right hip      03/19/21 1052            Diagnostic Studies: DG Pelvis Portable  Result Date: 03/19/2021 CLINICAL DATA:  Postop right hip replacement. EXAM: PORTABLE PELVIS 1-2 VIEWS COMPARISON:  None. FINDINGS: Right hip arthroplasty in expected alignment. No periprosthetic lucency or fracture. Recent postsurgical change includes air and edema in the soft tissues. Lateral skin staples in place. IMPRESSION: Right hip arthroplasty without immediate postoperative complication. Electronically Signed   By: Narda Rutherford M.D.   On: 03/19/2021 11:52   DG  C-Arm 1-60 Min-No Report  Result Date: 03/19/2021 Fluoroscopy was utilized by the requesting physician.  No radiographic interpretation.   DG HIP OPERATIVE UNILAT W OR W/O PELVIS RIGHT  Result Date: 03/19/2021 CLINICAL DATA:  65 year old female undergoing right hip replacement. EXAM: OPERATIVE choose 2 HIP (WITH PELVIS IF PERFORMED) 6 VIEWS TECHNIQUE: Fluoroscopic spot image(s) were submitted for interpretation post-operatively. COMPARISON:  Right hip series 12/05/2020. FINDINGS: 6 intraoperative fluoroscopic AP spot views of the lower pelvis and right hip demonstrate chronic right hip joint degeneration with subsequent bipolar hip arthroplasty placement. Visible hardware appears intact and aligned. No unexpected osseous changes. IMPRESSION: Right bipolar hip arthroplasty with no adverse features. Electronically Signed   By: Odessa Fleming M.D.   On: 03/19/2021 08:49    Disposition: Discharge disposition: 01-Home or Self Care          Follow-up Information     [facility], [facility] Follow up.   Specialty: Home Health Services Why: PT Contact information: [address] [place] [address] [phone]         Kathryne Hitch, MD. Schedule an appointment as soon as possible for a visit in 2 week(s).   Specialty: Orthopedic Surgery Contact information: [address] [place] [address] [phone]                  Signed: Richardean Canal 03/20/2021, 4:08 PM

## 2021-03-20 NOTE — Progress Notes (Signed)
Subjective: 1 Day Post-Op Procedure(s) (LRB): RIGHT HIP ARTHROPLASTY ANTERIOR APPROACH (Right) Patient reports pain as mild.  Progressing well with PT.   Objective: Vital signs in last 24 hours: Temp:  [98.1 F (36.7 C)-98.3 F (36.8 C)] 98.2 F (36.8 C) (09/09 1347) Pulse Rate:  [63-71] 65 (09/09 1347) Resp:  [17-20] 20 (09/09 1347) BP: (116-153)/(51-66) 118/63 (09/09 1347) SpO2:  [95 %-99 %] 97 % (09/09 1347)  Intake/Output from previous day: 09/08 0701 - 09/09 0700 In: 4713 [P.O.:1660; I.V.:2753; IV Piggyback:300] Out: 3225 [Urine:3075; Blood:150] Intake/Output this shift: Total I/O In: 376.9 [I.V.:376.9] Out: -   Recent Labs    03/19/21 0902 03/20/21 0322  HGB 10.8* 10.0*   Recent Labs    03/19/21 0902 03/20/21 0322  WBC 5.4 10.7*  RBC 3.65* 3.32*  HCT 32.9* 29.7*  PLT 196 193   Recent Labs    03/19/21 0541 03/20/21 0322  NA 140 137  K 4.6 4.2  CL 111 107  CO2 21* 25  BUN 20 22  CREATININE 0.69 0.87  GLUCOSE 103* 166*  CALCIUM 9.4 8.3*   No results for input(s): LABPT, INR in the last 72 hours.  Neurovascular intact Sensation intact distally Dorsiflexion/Plantar flexion intact Incision: scant drainage Compartment soft   Assessment/Plan: 1 Day Post-Op Procedure(s) (LRB): RIGHT HIP ARTHROPLASTY ANTERIOR APPROACH (Right) Discharge home with home health      Deauna Yaw 03/20/2021, 4:10 PM

## 2021-03-20 NOTE — Plan of Care (Signed)
Discharge instructions given to the patient. °

## 2021-03-20 NOTE — Discharge Instructions (Signed)
Dental Antibiotics:  In most cases prophylactic antibiotics for Dental procdeures after total joint surgery are not necessary.  Exceptions are as follows:  1. History of prior total joint infection  2. Severely immunocompromised (Organ Transplant, cancer chemotherapy, Rheumatoid biologic meds such as Humera)  3. Poorly controlled diabetes (A1C &gt; 8.0, blood glucose over 200)  If you have one of these conditions, contact your surgeon for an antibiotic prescription, prior to your dental procedure. INSTRUCTIONS AFTER JOINT REPLACEMENT   Remove items at home which could result in a fall. This includes throw rugs or furniture in walking pathways ICE to the affected joint every three hours while awake for 30 minutes at a time, for at least the first 3-5 days, and then as needed for pain and swelling.  Continue to use ice for pain and swelling. You may notice swelling that will progress down to the foot and ankle.  This is normal after surgery.  Elevate your leg when you are not up walking on it.   Continue to use the breathing machine you got in the hospital (incentive spirometer) which will help keep your temperature down.  It is common for your temperature to cycle up and down following surgery, especially at night when you are not up moving around and exerting yourself.  The breathing machine keeps your lungs expanded and your temperature down.   DIET:  As you were doing prior to hospitalization, we recommend a well-balanced diet.  DRESSING / WOUND CARE / SHOWERING  Keep the surgical dressing until follow up.  The dressing is water proof, so you can shower without any extra covering.  IF THE DRESSING FALLS OFF or the wound gets wet inside, change the dressing with sterile gauze.  Please use good hand washing techniques before changing the dressing.  Do not use any lotions or creams on the incision until instructed by your surgeon.    ACTIVITY  Increase activity slowly as tolerated, but  follow the weight bearing instructions below.   No driving for 6 weeks or until further direction given by your physician.  You cannot drive while taking narcotics.  No lifting or carrying greater than 10 lbs. until further directed by your surgeon. Avoid periods of inactivity such as sitting longer than an hour when not asleep. This helps prevent blood clots.  You may return to work once you are authorized by your doctor.     WEIGHT BEARING   Weight bearing as tolerated with assist device (walker, cane, etc) as directed, use it as [place] as suggested by your surgeon or therapist, typically at least 4-6 weeks.   EXERCISES  Results after joint replacement surgery are often greatly improved when you follow the exercise, range of motion and muscle strengthening exercises prescribed by your doctor. Safety measures are also important to protect the joint from further injury. Any time any of these exercises cause you to have increased pain or swelling, decrease what you are doing until you are comfortable again and then slowly increase them. If you have problems or questions, call your caregiver or physical therapist for advice.   Rehabilitation is important following a joint replacement. After just a few days of immobilization, the muscles of the leg can become weakened and shrink (atrophy).  These exercises are designed to build up the tone and strength of the thigh and leg muscles and to improve motion. Often times heat used for twenty to thirty minutes before working out will loosen up your tissues and help with   improving the range of motion but do not use heat for the first two weeks following surgery (sometimes heat can increase post-operative swelling).   These exercises can be done on a training (exercise) mat, on the floor, on a table or on a bed. Use whatever works the best and is most comfortable for you.    Use music or television while you are exercising so that the exercises are a pleasant  break in your day. This will make your life better with the exercises acting as a break in your routine that you can look forward to.   Perform all exercises about fifteen times, three times per day or as directed.  You should exercise both the operative leg and the other leg as well.  Exercises include:   Quad Sets - Tighten up the muscle on the front of the thigh (Quad) and hold for 5-10 seconds.   Straight Leg Raises - With your knee straight (if you were given a brace, keep it on), lift the leg to 60 degrees, hold for 3 seconds, and slowly lower the leg.  Perform this exercise against resistance later as your leg gets stronger.  Leg Slides: Lying on your back, slowly slide your foot toward your buttocks, bending your knee up off the floor (only go as far as is comfortable). Then slowly slide your foot back down until your leg is flat on the floor again.  Angel Wings: Lying on your back spread your legs to the side as far apart as you can without causing discomfort.  Hamstring Strength:  Lying on your back, push your heel against the floor with your leg straight by tightening up the muscles of your buttocks.  Repeat, but this time bend your knee to a comfortable angle, and push your heel against the floor.  You may put a pillow under the heel to make it more comfortable if necessary.   A rehabilitation program following joint replacement surgery can speed recovery and prevent re-injury in the future due to weakened muscles. Contact your doctor or a physical therapist for more information on knee rehabilitation.    CONSTIPATION  Constipation is defined medically as fewer than three stools per week and severe constipation as less than one stool per week.  Even if you have a regular bowel pattern at home, your normal regimen is likely to be disrupted due to multiple reasons following surgery.  Combination of anesthesia, postoperative narcotics, change in appetite and fluid intake all can affect your  bowels.   YOU MUST use at least one of the following options; they are listed in order of increasing strength to get the job done.  They are all available over the counter, and you may need to use some, POSSIBLY even all of these options:    Drink plenty of fluids (prune juice may be helpful) and high fiber foods Colace 100 mg by mouth twice a day  Senokot for constipation as directed and as needed Dulcolax (bisacodyl), take with full glass of water  Miralax (polyethylene glycol) once or twice a day as needed.  If you have tried all these things and are unable to have a bowel movement in the first 3-4 days after surgery call either your surgeon or your primary doctor.    If you experience loose stools or diarrhea, hold the medications until you stool forms back up.  If your symptoms do not get better within 1 week or if they get worse, check with your doctor.    If you experience "the worst abdominal pain ever" or develop nausea or vomiting, please contact the office immediately for further recommendations for treatment.   ITCHING:  If you experience itching with your medications, try taking only a single pain pill, or even half a pain pill at a time.  You can also use Benadryl over the counter for itching or also to help with sleep.   TED HOSE STOCKINGS:  Use stockings on both legs until for at least 2 weeks or as directed by physician office. They may be removed at night for sleeping.  MEDICATIONS:  See your medication summary on the "After Visit Summary" that nursing will review with you.  You may have some home medications which will be placed on hold until you complete the course of blood thinner medication.  It is important for you to complete the blood thinner medication as prescribed.  PRECAUTIONS:  If you experience chest pain or shortness of breath - call 911 immediately for transfer to the hospital emergency department.   If you develop a fever greater that 101 F, purulent drainage  from wound, increased redness or drainage from wound, foul odor from the wound/dressing, or calf pain - CONTACT YOUR SURGEON.                                                   FOLLOW-UP APPOINTMENTS:  If you do not already have a post-op appointment, please call the office for an appointment to be seen by your surgeon.  Guidelines for how soon to be seen are listed in your "After Visit Summary", but are typically between 1-4 weeks after surgery.  OTHER INSTRUCTIONS:   Knee Replacement:  Do not place pillow under knee, focus on keeping the knee straight while resting. CPM instructions: 0-90 degrees, 2 hours in the morning, 2 hours in the afternoon, and 2 hours in the evening. Place foam block, curve side up under heel at all times except when in CPM or when walking.  DO NOT modify, tear, cut, or change the foam block in any way.  POST-OPERATIVE OPIOID TAPER INSTRUCTIONS: It is important to wean off of your opioid medication as soon as possible. If you do not need pain medication after your surgery it is ok to stop day one. Opioids include: Codeine, Hydrocodone(Norco, Vicodin), Oxycodone(Percocet, oxycontin) and hydromorphone amongst others.  [place] term and even short term use of opiods can cause: Increased pain response Dependence Constipation Depression Respiratory depression And more.  Withdrawal symptoms can include Flu like symptoms Nausea, vomiting And more Techniques to manage these symptoms Hydrate well Eat regular healthy meals Stay active Use relaxation techniques(deep breathing, meditating, yoga) Do Not substitute Alcohol to help with tapering If you have been on opioids for less than two weeks and do not have pain than it is ok to stop all together.  Plan to wean off of opioids This plan should start within one week post op of your joint replacement. Maintain the same interval or time between taking each dose and first decrease the dose.  Cut the total daily intake of  opioids by one tablet each day Next start to increase the time between doses. The last dose that should be eliminated is the evening dose.   MAKE SURE YOU:  Understand these instructions.  Get help right away if you are not doing   well or get worse.    Thank you for letting us be a part of your medical care team.  It is a privilege we respect greatly.  We hope these instructions will help you stay on track for a fast and full recovery!       

## 2021-03-20 NOTE — Plan of Care (Signed)
  Problem: Clinical Measurements: Goal: Will remain free from infection Outcome: Progressing   Problem: Activity: Goal: Risk for activity intolerance will decrease Outcome: Progressing   Problem: Pain Managment: Goal: General experience of comfort will improve Outcome: Progressing   

## 2021-03-20 NOTE — TOC Transition Note (Signed)
Transition of Care [facility]) - CM/SW Discharge Note  Patient Details  Name: Meghan Martinez MRN: [id number] Date of Birth: 1955/10/28  [facility] CM/SW Contact:  Sherie Don, LCSW Phone Number: 03/20/2021, 9:46 AM  Clinical Narrative: Patient is expected to discharge after working with PT. CSW met with patient to review discharge plan and needs. Patient will need HHPT arranged as well as a 3N1; DME will be private pay as it is not covered by patient's insurance. Patient has a rolling walker and cane at home. CSW made [facility] referral to [facility] with [place]. [employer] to deliver 3N1 to patient's room. TOC signing off.  Final next level of care: [place] Barriers to Discharge: No Barriers Identified  Patient Goals and CMS Choice Patient states their goals for this hospitalization and ongoing recovery are:: Discharge home with [place] CMS Medicare.gov Compare Post Acute Care list provided to:: Patient Choice offered to / list presented to : Patient  Discharge Plan and Services         DME Arranged: 3-N-1 DME Agency: [employer] Date DME Agency Contacted: 03/20/21 Representative spoke with at DME Agency: Ovid Curd HH Arranged: PT [place]: [place] Date [place]: 03/20/21 Time [place]: [phone] Representative spoke with at [place]: Tommi Rumps  Readmission Risk Interventions No flowsheet data found.

## 2021-03-20 NOTE — Progress Notes (Signed)
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  Pt performed therex program and up to ambulate increased distance in halls.   Follow Up Recommendations  Home health PT     Equipment Recommendations       Recommendations for Other Services       Precautions / Restrictions Precautions Precautions: Fall Restrictions Weight Bearing Restrictions: No Other Position/Activity Restrictions: WBAT    Mobility  Bed Mobility Overal bed mobility: Needs Assistance Bed Mobility: Supine to Sit     Supine to sit: Min assist;Mod assist     General bed mobility comments: Increased time with cues for sequence and use of L LE to self assist    Transfers Overall transfer level: Needs assistance Equipment used: Rolling walker (2 wheeled) Transfers: Sit to/from Stand Sit to Stand: Min assist         General transfer comment: cues for LE management and use of UEs to self assist  Ambulation/Gait Ambulation/Gait assistance: Min assist Gait Distance (Feet): 111 Feet Assistive device: Rolling walker (2 wheeled) Gait Pattern/deviations: Step-to pattern;Decreased step length - right;Decreased step length - left;Shuffle;Trunk flexed Gait velocity: decr   General Gait Details: cues for sequence, posture and position from RW.  Assist to advance R LE initially.   Stairs             Wheelchair Mobility    Modified Rankin (Stroke Patients Only)       Balance Overall balance assessment: Needs assistance Sitting-balance support: No upper extremity supported;Feet supported Sitting balance-Leahy Scale: Good     Standing balance support: Bilateral upper extremity supported Standing balance-Leahy Scale: Fair                               Cognition Arousal/Alertness: Awake/alert Behavior During Therapy: WFL for tasks assessed/performed Overall Cognitive Status: Within Functional Limits for tasks assessed                                        Exercises Total Joint Exercises Ankle Circles/Pumps: AROM;Both;15 reps;Supine Quad Sets: AAROM;Right;10 reps;Supine Heel Slides: AAROM;20 reps;Supine;Right Hip ABduction/ADduction: AAROM;Right;15 reps;Supine    General Comments        Pertinent Vitals/Pain Pain Assessment: 0-10 Pain Score: 5  Pain Location: R hip Pain Descriptors / Indicators: Aching;Sore Pain Intervention(s): Limited activity within patient's tolerance;Monitored during session;Patient requesting pain meds-RN notified;RN gave pain meds during session    Home Living                      Prior Function            PT Goals (current goals can now be found in the care plan section) Acute Rehab PT Goals Patient Stated Goal: Regain IND PT Goal Formulation: With patient Time For Goal Achievement: 03/26/21 Potential to Achieve Goals: Good Progress towards PT goals: Progressing toward goals    Frequency    7X/week      PT Plan Current plan remains appropriate    Co-evaluation  AM-PAC PT "6 Clicks" Mobility   Outcome Measure  Help needed turning from your back to your side while in a flat bed without using bedrails?: A Lot Help needed moving from lying on your back to sitting on the side of a flat bed without using bedrails?: A Lot Help needed moving to and from a bed to a chair (including a wheelchair)?: A Little Help needed standing up from a chair using your arms (e.g., wheelchair or bedside chair)?: A Little Help needed to walk in hospital room?: A Little Help needed climbing 3-5 steps with a railing? : A Lot 6 Click Score: 15    End of Session Equipment Utilized During Treatment: Gait belt Activity Tolerance: Patient  tolerated treatment well Patient left: with call bell/phone within reach;Other (comment) (Pt up in bathroom with nursing in room) Nurse Communication: Mobility status PT Visit Diagnosis: Unsteadiness on feet (R26.81);Difficulty in walking, not elsewhere classified (R26.2);Pain Pain - Right/Left: Right Pain - part of body: Hip     Time: 0867-6195 PT Time Calculation (min) (ACUTE ONLY): 35 min  Charges:  $Gait Training: 8-22 mins $Therapeutic Exercise: 8-22 mins                     Mauro Kaufmann PT Acute Rehabilitation Services Pager [phone] Office [phone]    [place]ston Shawgo 03/20/2021, 12:57 PM

## 2021-03-20 NOTE — Progress Notes (Signed)
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  Pt up to ambulate increased distance in hall, negotiated stairs and reviewed car transfers.  Noted improvement an ability to advance R LE.  Pt hopeful for dc home this date.   Follow Up Recommendations  Home health PT     Equipment Recommendations       Recommendations for Other Services       Precautions / Restrictions Precautions Precautions: Fall Restrictions Weight Bearing Restrictions: No Other Position/Activity Restrictions: WBAT    Mobility  Bed Mobility Overal bed mobility: Needs Assistance Bed Mobility: Sit to Supine     Supine to sit: Min assist;Mod assist Sit to supine: Min guard   General bed mobility comments: Increased time with cues for sequence and pt using gait belt to bring R LE onto bed    Transfers Overall transfer level: Needs assistance Equipment used: Rolling walker (2 wheeled) Transfers: Sit to/from Stand Sit to Stand: Min guard;Supervision         General transfer comment: cues for LE management and use of UEs to self assist  Ambulation/Gait Ambulation/Gait assistance: Min guard;Supervision Gait Distance (Feet): 175 Feet Assistive device: Rolling walker (2 wheeled) Gait Pattern/deviations: Step-to pattern;Decreased step length - right;Decreased step length - left;Shuffle;Trunk flexed Gait velocity: decr   General Gait Details: cues for sequence, posture and position from RW.   Stairs Stairs: Yes Stairs assistance: Min assist;Min guard Stair Management: Two rails;Step to pattern;Forwards Number of Stairs: 10 General stair comments: cues for sequence   Wheelchair Mobility    Modified Rankin (Stroke Patients Only)       Balance Overall balance assessment: Needs  assistance Sitting-balance support: No upper extremity supported;Feet supported Sitting balance-Leahy Scale: Good     Standing balance support: Bilateral upper extremity supported Standing balance-Leahy Scale: Fair                              Cognition Arousal/Alertness: Awake/alert Behavior During Therapy: WFL for tasks assessed/performed Overall Cognitive Status: Within Functional Limits for tasks assessed                                        Exercises Total Joint Exercises Ankle Circles/Pumps: AROM;Both;15 reps;Supine Quad Sets: AAROM;Right;10 reps;Supine Heel Slides: AAROM;20 reps;Supine;Right Hip ABduction/ADduction: AAROM;Right;15 reps;Supine    General Comments        Pertinent Vitals/Pain Pain Assessment: 0-10 Pain Score: 5  Pain Location: R hip Pain Descriptors / Indicators: Aching;Sore Pain Intervention(s): Limited activity within patient's tolerance;Monitored during session;Premedicated before session;Ice applied    Home Living                      Prior Function            PT Goals (current goals can now be found in the care plan section) Acute Rehab PT Goals Patient Stated Goal: Regain IND PT Goal Formulation: With patient Time For Goal Achievement: 03/26/21 Potential to Achieve Goals: Good Progress towards PT goals: Progressing toward goals    Frequency    7X/week      PT Plan Current plan  remains appropriate    Co-evaluation              AM-PAC PT "6 Clicks" Mobility   Outcome Measure  Help needed turning from your back to your side while in a flat bed without using bedrails?: A Little Help needed moving from lying on your back to sitting on the side of a flat bed without using bedrails?: A Little Help needed moving to and from a bed to a chair (including a wheelchair)?: A Little Help needed standing up from a chair using your arms (e.g., wheelchair or bedside chair)?: A Little Help needed  to walk in hospital room?: A Little Help needed climbing 3-5 steps with a railing? : A Little 6 Click Score: 18    End of Session Equipment Utilized During Treatment: Gait belt Activity Tolerance: Patient tolerated treatment well Patient left: in bed;with call bell/phone within reach Nurse Communication: Mobility status PT Visit Diagnosis: Unsteadiness on feet (R26.81);Difficulty in walking, not elsewhere classified (R26.2);Pain Pain - Right/Left: Right Pain - part of body: Hip     Time: 4825-0037 PT Time Calculation (min) (ACUTE ONLY): 30 min  Charges:  $Gait Training: 8-22 mins $Therapeutic Exercise: 8-22 mins $Therapeutic Activity: 8-22 mins                     Mauro Kaufmann PT Acute Rehabilitation Services Pager [phone] Office [phone]    Deja Pisarski 03/20/2021, 2:30 PM

## 2021-03-25 ENCOUNTER — Encounter (HOSPITAL_COMMUNITY): Payer: Self-pay | Admitting: Orthopaedic Surgery

## 2021-04-02 ENCOUNTER — Other Ambulatory Visit: Payer: Self-pay

## 2021-04-02 ENCOUNTER — Ambulatory Visit (INDEPENDENT_AMBULATORY_CARE_PROVIDER_SITE_OTHER): Payer: BC Managed Care – PPO | Admitting: Orthopaedic Surgery

## 2021-04-02 DIAGNOSIS — Z96641 Presence of right artificial hip joint: Secondary | ICD-10-CM

## 2021-04-02 NOTE — Progress Notes (Signed)
The patient is coming in for first postoperative visit status post a right total hip arthroplasty.  She says she is doing well and has good range of motion and strength and is ambulating with a cane.  She has been taking her baby aspirin twice a day.  She is off all narcotics and is driving.  I will have her go back to just 1 baby aspirin a day for a week and then she can stop the baby aspirin.  I did remove her staples in place Steri-Strips.  I also drained about 50 cc of seroma off of the hip area.  Leg lengths are equal.  There is no evidence of infection.  She will slowly increase her activities as comfort allows.  I will see her back in 4 weeks to see how she is doing overall but no x-rays are needed.  All questions and concerns were answered and addressed.

## 2021-04-22 ENCOUNTER — Telehealth: Payer: Self-pay

## 2021-04-22 NOTE — Telephone Encounter (Signed)
[facility] called concerning waiting period and premedication if needed for patient.

## 2021-04-29 ENCOUNTER — Ambulatory Visit (INDEPENDENT_AMBULATORY_CARE_PROVIDER_SITE_OTHER): Payer: BC Managed Care – PPO | Admitting: Orthopaedic Surgery

## 2021-04-29 ENCOUNTER — Other Ambulatory Visit: Payer: Self-pay

## 2021-04-29 ENCOUNTER — Encounter: Payer: Self-pay | Admitting: Orthopaedic Surgery

## 2021-04-29 DIAGNOSIS — Z96641 Presence of right artificial hip joint: Secondary | ICD-10-CM

## 2021-04-29 NOTE — Progress Notes (Signed)
The patient comes in today now 6 weeks status post a right total hip arthroplasty.  She is doing great and has no issues or complaints at all.  She is very active 65 year old female.  Her right operative hip moves smoothly and fluidly with no issues at all.  She will continue to increase her activities as comfort allows.  I do not need to see her back until 6 months from now unless there are issues.  At her next visit we will have a standing low AP pelvis and lateral of her right hip.

## 2021-10-28 ENCOUNTER — Encounter: Payer: Self-pay | Admitting: Orthopaedic Surgery

## 2021-10-28 ENCOUNTER — Ambulatory Visit (INDEPENDENT_AMBULATORY_CARE_PROVIDER_SITE_OTHER): Payer: BC Managed Care – PPO

## 2021-10-28 ENCOUNTER — Ambulatory Visit: Payer: BC Managed Care – PPO | Admitting: Orthopaedic Surgery

## 2021-10-28 DIAGNOSIS — Z96641 Presence of right artificial hip joint: Secondary | ICD-10-CM

## 2021-10-28 NOTE — Progress Notes (Signed)
The patient is very well-known to me.  She is an active 66 year old female who is now 7 months status post a right direct anterior hip replacement.  She says she is doing well overall but still has some numbness and pain at times but overall she is very pleased.  She denies any issues with her left hip.  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  Those are still doing well. ? ?Her right operative hip moves smoothly and fluidly with no blocks to rotation and no significant pain.  Her left hip also moves smoothly and fluidly. ? ?An AP pelvis is standing and lateral of the right operative hip shows a well-seated total hip arthroplasty on the right side with no complicating features. ? ?This time follow-up for joints be as needed.  However she does understand that if things worsen anyway that she should not hesitate to give [place] a call. ?

## 2021-12-28 ENCOUNTER — Encounter: Payer: BC Managed Care – PPO | Admitting: Family Medicine

## 2021-12-29 ENCOUNTER — Encounter: Payer: Self-pay | Admitting: Family Medicine

## 2021-12-29 ENCOUNTER — Ambulatory Visit (INDEPENDENT_AMBULATORY_CARE_PROVIDER_SITE_OTHER): Payer: BC Managed Care – PPO | Admitting: Family Medicine

## 2021-12-29 VITALS — BP 122/70 | HR 78 | Temp 97.6°F | Ht 69.0 in | Wt 265.3 lb

## 2021-12-29 DIAGNOSIS — E669 Obesity, unspecified: Secondary | ICD-10-CM | POA: Diagnosis not present

## 2021-12-29 DIAGNOSIS — Z23 Encounter for immunization: Secondary | ICD-10-CM | POA: Diagnosis not present

## 2021-12-29 DIAGNOSIS — Z0000 Encounter for general adult medical examination without abnormal findings: Secondary | ICD-10-CM | POA: Diagnosis not present

## 2021-12-29 LAB — CBC WITH DIFFERENTIAL/PLATELET
Basophils Absolute: 0.1 10*3/uL (ref 0.0–0.1)
Basophils Relative: 1.1 % (ref 0.0–3.0)
Eosinophils Absolute: 0.1 10*3/uL (ref 0.0–0.7)
Eosinophils Relative: 2.4 % (ref 0.0–5.0)
HCT: 39.6 % (ref 36.0–46.0)
Hemoglobin: 13.4 g/dL (ref 12.0–15.0)
Lymphocytes Relative: 27.3 % (ref 12.0–46.0)
Lymphs Abs: 1.7 10*3/uL (ref 0.7–4.0)
MCHC: 33.8 g/dL (ref 30.0–36.0)
MCV: 85.6 fl (ref 78.0–100.0)
Monocytes Absolute: 0.6 10*3/uL (ref 0.1–1.0)
Monocytes Relative: 10 % (ref 3.0–12.0)
Neutro Abs: 3.6 10*3/uL (ref 1.4–7.7)
Neutrophils Relative %: 59.2 % (ref 43.0–77.0)
Platelets: 246 10*3/uL (ref 150.0–400.0)
RBC: 4.62 Mil/uL (ref 3.87–5.11)
RDW: 12.9 % (ref 11.5–15.5)
WBC: 6.1 10*3/uL (ref 4.0–10.5)

## 2021-12-29 LAB — COMPREHENSIVE METABOLIC PANEL
ALT: 13 U/L (ref 0–35)
AST: 14 U/L (ref 0–37)
Albumin: 4.2 g/dL (ref 3.5–5.2)
Alkaline Phosphatase: 91 U/L (ref 39–117)
BUN: 18 mg/dL (ref 6–23)
CO2: 27 mEq/L (ref 19–32)
Calcium: 9.7 mg/dL (ref 8.4–10.5)
Chloride: 102 mEq/L (ref 96–112)
Creatinine, Ser: 0.95 mg/dL (ref 0.40–1.20)
GFR: 62.54 mL/min (ref >60.00)
Glucose, Bld: 94 mg/dL (ref 70–99)
Potassium: 3.9 mEq/L (ref 3.5–5.1)
Sodium: 138 mEq/L (ref 135–145)
Total Bilirubin: 0.7 mg/dL (ref 0.2–1.2)
Total Protein: 7.4 g/dL (ref 6.0–8.3)

## 2021-12-29 LAB — LIPID PANEL
Cholesterol: 202 mg/dL — ABNORMAL HIGH (ref 0–200)
HDL: 44.6 mg/dL (ref >39.00)
LDL Cholesterol: 135 mg/dL — ABNORMAL HIGH (ref 0–99)
NonHDL: 157.15
Total CHOL/HDL Ratio: 5
Triglycerides: 112 mg/dL (ref 0.0–149.0)
VLDL: 22.4 mg/dL (ref 0.0–40.0)

## 2021-12-29 LAB — TSH: TSH: 2.15 u[IU]/mL (ref 0.35–5.50)

## 2021-12-29 NOTE — Patient Instructions (Signed)
Please return in 12 months for your annual complete physical; please come fasting.   I will release your lab results to you on your MyChart account with further instructions. You may see the results before I do, but when I review them I will send you a message with my report or have my assistant call you if things need to be discussed. Please reply to my message with any questions. Thank you!   Today you were given Pneumovax 23; this is to protect your from certain pneumonias. You will not need anymore pneumonia vaccinations.  If you have any questions or concerns, please don't hesitate to send me a message via MyChart or call the office at [phone]. Thank you for visiting with [place] today! It's our pleasure caring for you.   Please do these things to maintain good health!  Exercise at least 30-45 minutes a day,  4-5 days a week.  Eat a low-fat diet with lots of fruits and vegetables, up to 7-9 servings per day. Drink plenty of water daily. Try to drink 8 8oz glasses per day. Seatbelts can save your life. Always wear your seatbelt. Place Smoke Detectors on every level of your home and check batteries every year. Schedule an appointment with an eye doctor for an eye exam every 1-2 years Safe sex - use condoms to protect yourself from STDs if you could be exposed to these types of infections. Use birth control if you do not want to become pregnant and are sexually active. Avoid heavy alcohol use. If you drink, keep it to less than 2 drinks/day and not every day. Health Care Power of Attorney.  Choose someone you trust that could speak for you if you became unable to speak for yourself. Depression is common in our stressful world.If you're feeling down or losing interest in things you normally enjoy, please come in for a visit. If anyone is threatening or hurting you, please get help. Physical or Emotional Violence is never OK.

## 2021-12-29 NOTE — Progress Notes (Signed)
Subjective  Chief Complaint  Patient presents with   Annual Exam    Pt here for Annual exam and is currently fasting    HPI: Meghan Martinez is a 66 y.o. female who presents to [facility] at [place] today for a Female Wellness Visit. She also has the concerns and/or needs as listed above in the chief complaint. These will be addressed in addition to the Health Maintenance Visit.   Wellness Visit: annual visit with health maintenance review and exam without Pap  HM: screens are current. Eligible for pnuemovax today. Mammogram due in July and pt to schedule. Working full time. Doing well.  Brother is not doing well: hodgkin's lymphoma metastatic to liver. Just returned from visiting him. S/p right hip replacement in October 2022 with good results.   Assessment  1. Annual physical exam   2. Obesity (BMI 30-39.9)   3. Need for pneumococcal vaccination      Plan  Female Wellness Visit: Age appropriate Health Maintenance and Prevention measures were discussed with patient. Included topics are cancer screening recommendations, ways to keep healthy (see AVS) including dietary and exercise recommendations, regular eye and dental care, use of seat belts, and avoidance of moderate alcohol use and tobacco use. Pt to schedule mammogram. BMI: discussed patient's BMI and encouraged positive lifestyle modifications to help get to or maintain a target BMI. HM needs and immunizations were addressed and ordered. See below for orders. See HM and immunization section for updates. Pneumovax updated today. Routine labs and screening tests ordered including cmp, cbc and lipids where appropriate. Discussed recommendations regarding Vit D and calcium supplementation (see AVS)  Follow up: 12 mo for cpe  Orders Placed This Encounter  Procedures   Pneumococcal polysaccharide vaccine 23-valent greater than or equal to 2yo subcutaneous/IM   CBC with Differential/Platelet   Comprehensive  metabolic panel   Lipid Profile   TSH   No orders of the defined types were placed in this encounter.     Body mass index is 39.18 kg/m. 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 03/19/2021   Osteopenia 02/04/2021    Dexa July 2022: lowest T = - 1.3, forearm; ostseopenia; vit d calcium; recheck 2-3 years    Obesity (BMI 30-39.9) 12/26/2019   Mixed stress and urge urinary incontinence 09/15/2017   Benign colon polyp 03/17/2016   Family history of colon cancer 08/22/2014   Family history of premature CAD 08/22/2014   Health Maintenance  Topic Date Due   COVID-19 Vaccine (4 - Pfizer series) 01/14/2022 (Originally 07/17/2020)   MAMMOGRAM  01/15/2022   INFLUENZA VACCINE  02/09/2022   DEXA SCAN  01/16/2024   TETANUS/TDAP  11/25/2024   COLONOSCOPY (Pts 45-61yrs Insurance coverage will need to be confirmed)  07/14/2025   Pneumonia Vaccine 29+ Years old  Completed   Hepatitis C Screening  Completed   Zoster Vaccines- Shingrix  Completed   HPV VACCINES  Aged Out   Immunization History  Administered Date(s) Administered   PFIZER(Purple Top)SARS-COV-2 Vaccination 09/16/2019, 10/16/2019, 05/22/2020   Pneumococcal Conjugate-13 12/26/2020   Pneumococcal Polysaccharide-23 12/29/2021   Tdap 11/26/2014   Zoster Recombinat (Shingrix) 12/25/2018, 06/26/2019   Zoster, Live 03/17/2016   We updated and reviewed the patient's  past history in detail and it is documented below. Allergies: Patient has No Known Allergies. Past Medical History Patient  has a past medical history of COVID-19 (02/25/2021), History of chickenpox, Obesity (BMI 30-39.9) (12/26/2019), and Osteoarthritis of right hip (12/10/2020). Past Surgical History Patient  has a past surgical history that includes Replacement  total knee bilateral; Abdominal hysterectomy; Carpal tunnel release (Left); and Total hip arthroplasty (Right, 03/19/2021). Family History: Patient family history includes Colon cancer in her brother and mother; Lung cancer in her sister; Lung disease in her father; Non-Hodgkin's lymphoma in her brother. Social History:  Patient  reports that she has quit smoking. Her smoking use included cigars. She has never used smokeless tobacco. She reports current alcohol use. She reports that she does not use drugs.  Review of Systems: Constitutional: negative for fever or malaise Ophthalmic: negative for photophobia, double vision or loss of vision Cardiovascular: negative for chest pain, dyspnea on exertion, or new LE swelling Respiratory: negative for SOB or persistent cough Gastrointestinal: negative for abdominal pain, change in bowel habits or melena Genitourinary: negative for dysuria or gross hematuria, no abnormal uterine bleeding or disharge Musculoskeletal: negative for new gait disturbance or muscular weakness Integumentary: negative for new or persistent rashes, no breast lumps Neurological: negative for TIA or stroke symptoms Psychiatric: negative for SI or delusions Allergic/Immunologic: negative for hives  Patient Care Team    Relationship Specialty Notifications Start End  Willow Ora, MD PCP - General Family Medicine  09/15/17   Ditty, Loura Halt, MD Consulting Physician Neurosurgery  09/15/17     Objective  Vitals: BP 122/70   Pulse 78   Temp 97.6 F (36.4 C)   Ht 5\' 9"  (1.753 m)   Wt 265 lb 4.8 oz (120.3 kg)   SpO2 97%   BMI 39.18 kg/m  General:  Well developed, well nourished, no acute distress  Psych:  Alert and orientedx3,normal mood and affect HEENT:  Normocephalic, atraumatic, non-icteric sclera,  supple neck without adenopathy, mass or thyromegaly Cardiovascular:  Normal S1, S2, RRR without gallop, rub or murmur Respiratory:  Good breath sounds bilaterally,  CTAB with normal respiratory effort Gastrointestinal: normal bowel sounds, soft, non-tender, no noted masses. No HSM MSK: no deformities, contusions. Joints are without erythema or swelling.  Skin:  Warm, no rashes or suspicious lesions noted Neurologic:    Mental status is normal. Gross motor and sensory exams are normal. Normal gait. No tremor   Commons side effects, risks, benefits, and alternatives for medications and treatment plan prescribed today were discussed, and the patient expressed understanding of the given instructions. Patient is instructed to call or message via MyChart if he/she has any questions or concerns regarding our treatment plan. No barriers to understanding were identified. We discussed Red Flag symptoms and signs in detail. Patient expressed understanding regarding what to do in case of urgent or emergency type symptoms.  Medication list was reconciled, printed and provided to the patient in AVS. Patient instructions and summary information was reviewed with the patient as documented in the AVS. This note was prepared with assistance of Dragon voice recognition software. Occasional wrong-word or sound-a-like substitutions may have occurred due to the inherent limitations of voice recognition software  This visit occurred during the SARS-CoV-2 public health emergency.  Safety protocols were in place, including screening questions prior to the visit, additional usage of staff PPE, and extensive cleaning of exam room while observing appropriate contact time as indicated for disinfecting solutions.

## 2022-02-17 ENCOUNTER — Other Ambulatory Visit: Payer: Self-pay | Admitting: Family Medicine

## 2022-02-17 DIAGNOSIS — Z1231 Encounter for screening mammogram for malignant neoplasm of breast: Secondary | ICD-10-CM

## 2022-03-03 ENCOUNTER — Ambulatory Visit
Admission: RE | Admit: 2022-03-03 | Discharge: 2022-03-03 | Disposition: A | Discharge status: Home / Self Care | Payer: BC Managed Care – PPO | Source: Ambulatory Visit | Attending: Family Medicine | Admitting: Family Medicine

## 2022-03-03 DIAGNOSIS — Z1231 Encounter for screening mammogram for malignant neoplasm of breast: Secondary | ICD-10-CM

## 2022-04-05 ENCOUNTER — Encounter: Payer: Self-pay | Admitting: *Deleted

## 2022-06-24 ENCOUNTER — Encounter: Payer: Self-pay | Admitting: *Deleted

## 2023-01-05 ENCOUNTER — Ambulatory Visit (INDEPENDENT_AMBULATORY_CARE_PROVIDER_SITE_OTHER): Payer: BC Managed Care – PPO | Admitting: Family Medicine

## 2023-01-05 ENCOUNTER — Encounter: Payer: Self-pay | Admitting: Family Medicine

## 2023-01-05 VITALS — BP 128/72 | HR 71 | Temp 97.6°F | Ht 69.0 in | Wt 257.2 lb

## 2023-01-05 DIAGNOSIS — N3946 Mixed incontinence: Secondary | ICD-10-CM

## 2023-01-05 DIAGNOSIS — Z800 Family history of malignant neoplasm of digestive organs: Secondary | ICD-10-CM | POA: Diagnosis not present

## 2023-01-05 DIAGNOSIS — Z0000 Encounter for general adult medical examination without abnormal findings: Secondary | ICD-10-CM

## 2023-01-05 DIAGNOSIS — E669 Obesity, unspecified: Secondary | ICD-10-CM

## 2023-01-05 DIAGNOSIS — M8580 Other specified disorders of bone density and structure, unspecified site: Secondary | ICD-10-CM

## 2023-01-05 DIAGNOSIS — K635 Polyp of colon: Secondary | ICD-10-CM | POA: Diagnosis not present

## 2023-01-05 LAB — COMPREHENSIVE METABOLIC PANEL
ALT: 11 U/L (ref 0–35)
AST: 13 U/L (ref 0–37)
Albumin: 4.1 g/dL (ref 3.5–5.2)
Alkaline Phosphatase: 73 U/L (ref 39–117)
BUN: 17 mg/dL (ref 6–23)
CO2: 27 mEq/L (ref 19–32)
Calcium: 9.7 mg/dL (ref 8.4–10.5)
Chloride: 105 mEq/L (ref 96–112)
Creatinine, Ser: 0.87 mg/dL (ref 0.40–1.20)
GFR: 69.01 mL/min (ref >60.00)
Glucose, Bld: 95 mg/dL (ref 70–99)
Potassium: 4.3 mEq/L (ref 3.5–5.1)
Sodium: 140 mEq/L (ref 135–145)
Total Bilirubin: 0.7 mg/dL (ref 0.2–1.2)
Total Protein: 6.8 g/dL (ref 6.0–8.3)

## 2023-01-05 LAB — CBC WITH DIFFERENTIAL/PLATELET
Basophils Absolute: 0 10*3/uL (ref 0.0–0.1)
Basophils Relative: 0.8 % (ref 0.0–3.0)
Eosinophils Absolute: 0.1 10*3/uL (ref 0.0–0.7)
Eosinophils Relative: 2.3 % (ref 0.0–5.0)
HCT: 38.5 % (ref 36.0–46.0)
Hemoglobin: 12.7 g/dL (ref 12.0–15.0)
Lymphocytes Relative: 26 % (ref 12.0–46.0)
Lymphs Abs: 1.2 10*3/uL (ref 0.7–4.0)
MCHC: 32.9 g/dL (ref 30.0–36.0)
MCV: 86.4 fl (ref 78.0–100.0)
Monocytes Absolute: 0.5 10*3/uL (ref 0.1–1.0)
Monocytes Relative: 9.4 % (ref 3.0–12.0)
Neutro Abs: 2.9 10*3/uL (ref 1.4–7.7)
Neutrophils Relative %: 61.5 % (ref 43.0–77.0)
Platelets: 225 10*3/uL (ref 150.0–400.0)
RBC: 4.46 Mil/uL (ref 3.87–5.11)
RDW: 13.3 % (ref 11.5–15.5)
WBC: 4.8 10*3/uL (ref 4.0–10.5)

## 2023-01-05 LAB — TSH: TSH: 1.61 u[IU]/mL (ref 0.35–5.50)

## 2023-01-05 LAB — LIPID PANEL
Cholesterol: 173 mg/dL (ref 0–200)
HDL: 38.7 mg/dL — ABNORMAL LOW (ref >39.00)
LDL Cholesterol: 115 mg/dL — ABNORMAL HIGH (ref 0–99)
NonHDL: 134.14
Total CHOL/HDL Ratio: 4
Triglycerides: 94 mg/dL (ref 0.0–149.0)
VLDL: 18.8 mg/dL (ref 0.0–40.0)

## 2023-01-05 LAB — VITAMIN D 25 HYDROXY (VIT D DEFICIENCY, FRACTURES): VITD: 31.83 ng/mL (ref 30.00–100.00)

## 2023-01-05 LAB — HEMOGLOBIN A1C: Hgb A1c MFr Bld: 5.7 % (ref 4.6–6.5)

## 2023-01-05 MED ORDER — SOLIFENACIN SUCCINATE 10 MG PO TABS: 10.0000 mg | ORAL_TABLET | Freq: Every day | ORAL | 5 refills | Status: DC

## 2023-01-05 NOTE — Progress Notes (Signed)
Subjective  Chief Complaint  Patient presents with   Annual Exam    Pt here for Annual Exam and is currently fasting     HPI: Meghan Martinez is a 67 y.o. female who presents to [facility] at [place] today for a Female Wellness Visit. She also has the concerns and/or needs as listed above in the chief complaint. These will be addressed in addition to the Health Maintenance Visit.   Wellness Visit: annual visit with health maintenance review and exam without Pap  Health maintenance: All screens are current.  Has mammogram scheduled for later this year.  Family history of colon cancer with colon polyp next due for colonoscopy in 2026 Chronic disease f/u and/or acute problem visit: (deemed necessary to be done in addition to the wellness visit): He has osteoarthritic pains here and there but mostly does well.  He had some low back pain that has resolved.  Working full-time. Interested in weight loss medicine if it were to be covered Mixed stress and urge urinary continence: Not terribly bothersome but she does have polyuria and gets up a few times at night to use the bathroom.  No irritative symptoms.  Has never tried medications.  Assessment  1. Annual physical exam   2. Family history of colon cancer   3. Benign colon polyp   4. Osteopenia, unspecified location   5. Obesity (BMI 30-39.9)   6. Mixed stress and urge urinary incontinence      Plan  Female Wellness Visit: Age appropriate Health Maintenance and Prevention measures were discussed with patient. Included topics are cancer screening recommendations, ways to keep healthy (see AVS) including dietary and exercise recommendations, regular eye and dental care, use of seat belts, and avoidance of moderate alcohol use and tobacco use.  Screens are current BMI: discussed patient's BMI and encouraged positive lifestyle modifications to help get to or maintain a target BMI. HM needs and immunizations were addressed and  ordered. See below for orders. See HM and immunization section for updates. Routine labs and screening tests ordered including cmp, cbc and lipids where appropriate. Discussed recommendations regarding Vit D and calcium supplementation (see AVS)  Chronic disease management visit and/or acute problem visit: Overactive bladder/urinary symptoms: Trial of Vesicare. Follow up: 12 months for complete physical Orders Placed This Encounter  Procedures   CBC with Differential/Platelet   Comprehensive metabolic panel   Lipid panel   TSH   VITAMIN D 25 Hydroxy (Vit-D Deficiency, Fractures)   Hemoglobin A1c   Meds ordered this encounter  Medications   solifenacin (VESICARE) 10 MG tablet    Sig: Take 1 tablet (10 mg total) by mouth daily.    Dispense:  30 tablet    Refill:  5      Body mass index is 37.98 kg/m. 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 03/19/2021   Osteopenia 02/04/2021    Dexa July 2022: lowest T = - 1.3, forearm; ostseopenia; vit d calcium; recheck 2-3 years    Obesity (BMI 30-39.9) 12/26/2019   Mixed stress and urge urinary incontinence 09/15/2017   Benign colon polyp 03/17/2016   Family history of colon cancer 08/22/2014   Family history of premature CAD 08/22/2014   Health Maintenance  Topic Date Due   COVID-19 Vaccine (4 - 2023-24 season) 01/21/2023 (Originally 03/12/2022)   INFLUENZA VACCINE  02/10/2023   MAMMOGRAM  03/04/2023   DEXA SCAN  01/16/2024   DTaP/Tdap/Td (2 - Td or Tdap) 11/25/2024   Colonoscopy  07/14/2025   Pneumonia Vaccine 59+ Years old  Completed   Hepatitis C Screening  Completed   Zoster Vaccines- Shingrix  Completed   HPV VACCINES  Aged Out   Immunization History  Administered Date(s) Administered   PFIZER(Purple Top)SARS-COV-2  Vaccination 09/16/2019, 10/16/2019, 05/22/2020   Pneumococcal Conjugate-13 12/26/2020   Pneumococcal Polysaccharide-23 12/29/2021   Tdap 11/26/2014   Zoster Recombinat (Shingrix) 12/25/2018, 06/26/2019   Zoster, Live 03/17/2016   We updated and reviewed the patient's past history in detail and it is documented below. Allergies: Patient has No Known Allergies. Past Medical History Patient  has a past medical history of COVID-19 (02/25/2021), History of chickenpox, Obesity (BMI 30-39.9) (12/26/2019), and Osteoarthritis of right hip (12/10/2020). Past Surgical History Patient  has a past surgical history that includes Replacement total knee bilateral; Abdominal hysterectomy; Carpal tunnel release (Left); and Total hip arthroplasty (Right, 03/19/2021). Family History: Patient family history includes Colon cancer in her brother and mother; Lung cancer in her sister; Lung disease in her father; Non-Hodgkin's lymphoma in her brother. Social History:  Patient  reports that she has quit smoking. Her smoking use included cigars. She has never used smokeless tobacco. She reports current alcohol use. She reports that she does not use drugs.  Review of Systems: Constitutional: negative for fever or malaise Ophthalmic: negative for photophobia, double vision or loss of vision Cardiovascular: negative for chest pain, dyspnea on exertion, or new LE swelling Respiratory: negative for SOB or persistent cough Gastrointestinal: negative for abdominal pain, change in bowel habits or melena Genitourinary: negative for dysuria or gross hematuria, no abnormal uterine bleeding or disharge Musculoskeletal: negative for new gait disturbance or muscular weakness Integumentary: negative for new or persistent rashes, no breast lumps Neurological: negative for TIA or stroke symptoms Psychiatric: negative for SI or delusions Allergic/Immunologic: negative for hives  Patient Care Team    Relationship Specialty  Notifications Start End  Willow Ora, MD PCP - General Family Medicine  09/15/17   Ditty, Loura Halt, MD Consulting Physician Neurosurgery  09/15/17     Objective  Vitals: BP 128/72   Pulse 71   Temp 97.6 F (36.4 C)   Ht 5\' 9"  (1.753 m)   Wt 257 lb 3.2 oz (116.7 kg)   SpO2 96%   BMI 37.98 kg/m  General:  Well developed, well nourished, no acute distress  Psych:  Alert and orientedx3,normal mood and affect HEENT:  Normocephalic, atraumatic, non-icteric sclera,  supple neck without adenopathy, mass or thyromegaly Cardiovascular:  Normal S1, S2, RRR without gallop, rub or murmur Respiratory:  Good breath sounds bilaterally, CTAB with normal respiratory effort Gastrointestinal: normal bowel sounds, soft, non-tender, no noted masses. No HSM MSK: extremities without edema, joints without erythema or swelling Neurologic:    Mental status is normal.  Gross motor and sensory exams are normal.  No tremor  Commons side effects, risks, benefits, and alternatives for medications and treatment plan prescribed today were discussed, and the patient expressed understanding of the given instructions. Patient is instructed to call or message via MyChart if he/she has any questions or concerns regarding our treatment plan. No barriers to understanding were identified. We discussed Red Flag symptoms and signs in detail. Patient expressed understanding regarding what to do  in case of urgent or emergency type symptoms.  Medication list was reconciled, printed and provided to the patient in AVS. Patient instructions and summary information was reviewed with the patient as documented in the AVS. This note was prepared with assistance of Dragon voice recognition software. Occasional wrong-word or sound-a-like substitutions may have occurred due to the inherent limitations of voice recognition software

## 2023-01-05 NOTE — Patient Instructions (Signed)
Please return in 12 months for your annual complete physical; please come fasting.   I will release your lab results to you on your MyChart account with further instructions. You may see the results before I do, but when I review them I will send you a message with my report or have my assistant call you if things need to be discussed. Please reply to my message with any questions. Thank you!   If you have any questions or concerns, please don't hesitate to send me a message via MyChart or call the office at [phone]. Thank you for visiting with us today! It's our pleasure caring for you.   Calcium Intake Recommendations You can take Caltrate Plus twice a day or get it through your diet or other OTC supplements (Viactiv, OsCal etc)  Calcium is a mineral that affects many functions in the body, including: Blood clotting. Blood vessel function. Nerve impulse conduction. Hormone secretion. Muscle contraction. Bone and teeth functions.  Most of your body's calcium supply is stored in your bones and teeth. When your calcium stores are low, you may be at risk for low bone mass, bone loss, and bone fractures. Consuming enough calcium helps to grow healthy bones and teeth and to prevent breakdown over time. It is very important that you get enough calcium if you are: A child undergoing rapid growth. An adolescent girl. A pre- or post-menopausal woman. A woman whose menstrual cycle has stopped due to anorexia nervosa or regular intense exercise. An individual with lactose intolerance or a milk allergy. A vegetarian.  What is my plan? Try to consume the recommended amount of calcium daily based on your age. Depending on your overall health, your health care provider may recommend increased calcium intake. General daily calcium intake recommendations by age are: Birth to 6 months: 200 mg. Infants 7 to 12 months: 260 mg. Children 1 to 3 years: 700 mg. Children 4 to 8 years: 1,000 mg. Children  9 to 13 years: 1,300 mg. Teens 14 to 18 years: 1,300 mg. Adults 19 to 50 years: 1,000 mg. Adult women 51 to 70 years: 1,200 mg. Adult men 51 to 70 years: 1,000 mg. Adults 71 years and older: 1,200 mg. Pregnant and breastfeeding teens: 1,300 mg. Pregnant and breastfeeding adults: 1,000 mg.  What do I need to know about calcium intake? In order for the body to absorb calcium, it needs vitamin D. You can get vitamin D through (we recommend getting 400-1000 units of Vitamin D daily) Direct exposure of the skin to sunlight. Foods, such as egg yolks, liver, saltwater fish, and fortified milk. Supplements. Consuming too much calcium may cause: Constipation. Decreased absorption of iron and zinc. Kidney stones. Calcium supplements may interact with certain medicines. Check with your health care provider before starting any calcium supplements. Try to get most of your calcium from food. What foods can I eat? Grains  Fortified oatmeal. Fortified ready-to-eat cereals. Fortified frozen waffles. Vegetables Turnip greens. Broccoli. Fruits Fortified orange juice. Meats and Other Protein Sources Canned sardines with bones. Canned salmon with bones. Soy beans. Tofu. Baked beans. Almonds. Brazil nuts. Sunflower seeds. Dairy Milk. Yogurt. Cheese. Cottage cheese. Beverages Fortified soy milk. Fortified rice milk. Sweets/Desserts Pudding. Ice Cream. Milkshakes. Blackstrap molasses. The items listed above may not be a complete list of recommended foods or beverages. Contact your dietitian for more options. What foods can affect my calcium intake? It may be more difficult for your body to use calcium or calcium may leave your   body more quickly if you consume large amounts of: Sodium. Protein. Caffeine. Alcohol.  This information is not intended to replace advice given to you by your health care provider. Make sure you discuss any questions you have with your health care provider. Document  Released: 02/10/2004 Document Revised: 01/16/2016 Document Reviewed: 12/04/2013 Elsevier Interactive Patient Education  2018 [employer]vier Inc.  

## 2023-01-10 NOTE — Progress Notes (Signed)
Labs reviewed.  The 10-year ASCVD risk score (Arnett DK, et al., 2019) is: 7.3%   Values used to calculate the score:     Age: 67 years     Sex: Female     Is Non-Hispanic African American: No     Diabetic: No     Tobacco smoker: No     Systolic Blood Pressure: 128 mmHg     Is BP treated: No     HDL Cholesterol: 38.7 mg/dL     Total Cholesterol: 173 mg/dL  Dear Meghan Martinez, Thank you for allowing me to care for you at your recent office visit.  I wanted to let you know that I have reviewed your lab test results and am happy to report that they are all normal.  Your cholesterol levels are borderline high and your vascular risk score is borderline. In the future, we may discuss using medication to lower these things for you.  Eat healthy and stay well.   Sincerely, Dr. Mardelle Matte

## 2023-02-23 ENCOUNTER — Other Ambulatory Visit: Payer: Self-pay | Admitting: Family Medicine

## 2023-02-23 DIAGNOSIS — Z1231 Encounter for screening mammogram for malignant neoplasm of breast: Secondary | ICD-10-CM

## 2023-03-09 ENCOUNTER — Ambulatory Visit: Payer: BC Managed Care – PPO

## 2023-03-10 ENCOUNTER — Ambulatory Visit
Admission: RE | Admit: 2023-03-10 | Discharge: 2023-03-10 | Disposition: A | Discharge status: Home / Self Care | Payer: BC Managed Care – PPO | Source: Ambulatory Visit | Attending: Family Medicine | Admitting: Family Medicine

## 2023-03-10 DIAGNOSIS — Z1231 Encounter for screening mammogram for malignant neoplasm of breast: Secondary | ICD-10-CM

## 2023-06-07 ENCOUNTER — Encounter: Payer: Self-pay | Admitting: Family Medicine

## 2023-06-07 ENCOUNTER — Other Ambulatory Visit: Payer: Self-pay

## 2023-06-07 MED ORDER — SOLIFENACIN SUCCINATE 10 MG PO TABS: 10.0000 mg | ORAL_TABLET | Freq: Every day | ORAL | 5 refills | Status: DC

## 2023-08-26 ENCOUNTER — Encounter: Payer: Self-pay | Admitting: Family Medicine

## 2023-08-26 ENCOUNTER — Ambulatory Visit: Payer: 59 | Admitting: Family Medicine

## 2023-08-26 VITALS — BP 135/78 | HR 62 | Temp 97.7°F | Ht 69.0 in | Wt 209.6 lb

## 2023-08-26 DIAGNOSIS — N6311 Unspecified lump in the right breast, upper outer quadrant: Secondary | ICD-10-CM | POA: Diagnosis not present

## 2023-08-26 DIAGNOSIS — E669 Obesity, unspecified: Secondary | ICD-10-CM | POA: Diagnosis not present

## 2023-08-26 NOTE — Progress Notes (Signed)
Subjective  CC:  Chief Complaint  Patient presents with   Breast Problem    Pt stated that she noticed a hard lump/knot in here Rt beast for the past 3 weeks. She was waiting to see if it would have gone away but still there.    HPI: Meghan Martinez is a 68 y.o. female who presents to the office today to address the problems listed above in the chief complaint. 68 year old who noted a hard nontender breast mass in the right breast about 3 weeks ago.  Unchanged.  Mammograms are current with last being in September 2024.  No family history of breast cancer.  She is G0.  Postmenopausal.  Not on HRT.  Non-smoker. Obesity: Has lost a significant amount of weight with healthy diet and feeding.  Feels better.  Doing some weight training.  Working on the elliptical.  Assessment  1. Mass of upper outer quadrant of right breast   2. Obesity (BMI 30-39.9)      Plan  Right breast mass upper outer quadrant: Firm mobile and needs imaging.  Stat mammogram and ultrasound ordered.  Education given.  Will follow-up on results. Obesity: Praised for weight loss.  Continue healthy diet and exercise.  Add strength training.  Follow up: For complete physical as scheduled 01/06/2024  Orders Placed This Encounter  Procedures   [place] LIMITED ULTRASOUND INCLUDING AXILLA RIGHT BREAST   MM 3D DIAGNOSTIC MAMMOGRAM UNILATERAL RIGHT BREAST   No orders of the defined types were placed in this encounter.     I reviewed the patients updated PMH, FH, and SocHx.    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   solifenacin  (VESICARE) 10 MG tablet Take 1 tablet (10 mg total) by mouth daily.    Allergies: Patient has no known allergies. Family History: Patient family history includes Colon cancer in her brother and mother; Lung cancer in her sister; Lung disease in her father; Non-Hodgkin's lymphoma in her brother. Social History:  Patient  reports that she has quit smoking. Her smoking use included cigars. She has never used smokeless tobacco. She reports current alcohol use. She reports that she does not use drugs.  Review of Systems: Constitutional: Negative for fever malaise or anorexia Cardiovascular: negative for chest pain Respiratory: negative for SOB or persistent cough Gastrointestinal: negative for abdominal pain  Objective  Vitals: BP 135/78   Pulse 62   Temp 97.7 F (36.5 C)   Ht 5\' 9"  (1.753 m)   Wt 209 lb 9.6 oz (95.1 kg)   SpO2 100%   BMI 30.95 kg/m  General: no acute distress , A&Ox3 Breast exam: Right breast with approximately 1 cm nontender firm mass upper outer quadrant, no nipple discharge, no lymphadenopathy, normal skin appearance.  Left breast exam is normal  Commons side effects, risks, benefits, and alternatives for medications and treatment plan prescribed today were discussed, and the patient expressed understanding of the given instructions. Patient is instructed to call or message via MyChart if he/she has any questions or concerns regarding our treatment plan. No barriers to  understanding were identified. We discussed Red Flag symptoms and signs in detail. Patient expressed understanding regarding what to do in case of urgent or emergency type symptoms.  Medication list was reconciled, printed and provided to the patient in AVS. Patient instructions and summary information was reviewed with the patient as documented in the AVS. This note was prepared with assistance of Dragon voice recognition software. Occasional wrong-word or sound-a-like substitutions may have occurred due  to the inherent limitations of voice recognition software

## 2023-08-30 ENCOUNTER — Ambulatory Visit
Admission: RE | Admit: 2023-08-30 | Discharge: 2023-08-30 | Disposition: A | Discharge status: Home / Self Care | Payer: 59 | Source: Ambulatory Visit | Attending: Family Medicine | Admitting: Family Medicine

## 2023-08-30 ENCOUNTER — Ambulatory Visit: Payer: 59 | Admitting: Family Medicine

## 2023-08-30 DIAGNOSIS — N6311 Unspecified lump in the right breast, upper outer quadrant: Secondary | ICD-10-CM

## 2023-08-31 ENCOUNTER — Encounter: Payer: Self-pay | Admitting: Family Medicine

## 2023-08-31 NOTE — Progress Notes (Signed)
 See mychart note Dear Ms. Plantz, I have reviewed your results. So glad this looks good! Take care.  Sincerely, Dr. Mardelle Matte

## 2023-12-19 ENCOUNTER — Other Ambulatory Visit: Payer: Self-pay | Admitting: Family Medicine

## 2023-12-19 MED ORDER — SOLIFENACIN SUCCINATE 10 MG PO TABS: 10.0000 mg | ORAL_TABLET | Freq: Every day | ORAL | 5 refills | Status: DC

## 2023-12-19 NOTE — Telephone Encounter (Signed)
 Copied from CRM [id number]. Topic: Clinical - Medication Refill >> Dec 19, 2023  9:31 AM Rosamond Comes wrote: Medication: solifenacin  (VESICARE ) 10 MG tablet  Has the patient contacted their pharmacy? No Pharmacy stated to call provider to get refill   This is the patient's preferred pharmacy:   [id number] Jonette Nestle, [place] - [address] AT [facility] OF [facility] [address] [place] [address] Phone: [phone] Fax: [phone]  Is this the correct pharmacy for this prescription? Yes If no, delete pharmacy and type the correct one.   Has the prescription been filled recently? No  Is the patient out of the medication? Yes  Has the patient been seen for an appointment in the last year OR does the patient have an upcoming appointment? Yes  Can we respond through MyChart? Yes  Agent: Please be advised that Rx refills may take up to 3 business days. We ask that you follow-up with your pharmacy.

## 2024-01-06 ENCOUNTER — Encounter: Payer: BC Managed Care – PPO | Admitting: Family Medicine

## 2024-01-17 ENCOUNTER — Encounter: Payer: Self-pay | Admitting: Family Medicine

## 2024-01-17 ENCOUNTER — Ambulatory Visit (INDEPENDENT_AMBULATORY_CARE_PROVIDER_SITE_OTHER): Admitting: Family Medicine

## 2024-01-17 VITALS — BP 122/63 | HR 66 | Temp 98.6°F | Ht 69.0 in | Wt 209.8 lb

## 2024-01-17 DIAGNOSIS — Z13220 Encounter for screening for lipoid disorders: Secondary | ICD-10-CM

## 2024-01-17 DIAGNOSIS — N3946 Mixed incontinence: Secondary | ICD-10-CM

## 2024-01-17 DIAGNOSIS — Z8249 Family history of ischemic heart disease and other diseases of the circulatory system: Secondary | ICD-10-CM

## 2024-01-17 DIAGNOSIS — Z800 Family history of malignant neoplasm of digestive organs: Secondary | ICD-10-CM | POA: Diagnosis not present

## 2024-01-17 DIAGNOSIS — Z0000 Encounter for general adult medical examination without abnormal findings: Secondary | ICD-10-CM

## 2024-01-17 DIAGNOSIS — Z0001 Encounter for general adult medical examination with abnormal findings: Secondary | ICD-10-CM

## 2024-01-17 DIAGNOSIS — M8580 Other specified disorders of bone density and structure, unspecified site: Secondary | ICD-10-CM

## 2024-01-17 DIAGNOSIS — Z780 Asymptomatic menopausal state: Secondary | ICD-10-CM

## 2024-01-17 LAB — COMPREHENSIVE METABOLIC PANEL WITH GFR
ALT: 14 U/L (ref 0–35)
AST: 17 U/L (ref 0–37)
Albumin: 4.3 g/dL (ref 3.5–5.2)
Alkaline Phosphatase: 63 U/L (ref 39–117)
BUN: 17 mg/dL (ref 6–23)
CO2: 27 meq/L (ref 19–32)
Calcium: 9.6 mg/dL (ref 8.4–10.5)
Chloride: 104 meq/L (ref 96–112)
Creatinine, Ser: 0.84 mg/dL (ref 0.40–1.20)
GFR: 71.45 mL/min (ref >60.00)
Glucose, Bld: 87 mg/dL (ref 70–99)
Potassium: 4 meq/L (ref 3.5–5.1)
Sodium: 139 meq/L (ref 135–145)
Total Bilirubin: 0.6 mg/dL (ref 0.2–1.2)
Total Protein: 7.1 g/dL (ref 6.0–8.3)

## 2024-01-17 LAB — CBC WITH DIFFERENTIAL/PLATELET
Basophils Absolute: 0.1 K/uL (ref 0.0–0.1)
Basophils Relative: 1.3 % (ref 0.0–3.0)
Eosinophils Absolute: 0.2 K/uL (ref 0.0–0.7)
Eosinophils Relative: 4.7 % (ref 0.0–5.0)
HCT: 38.1 % (ref 36.0–46.0)
Hemoglobin: 12.9 g/dL (ref 12.0–15.0)
Lymphocytes Relative: 28.3 % (ref 12.0–46.0)
Lymphs Abs: 1.3 K/uL (ref 0.7–4.0)
MCHC: 34 g/dL (ref 30.0–36.0)
MCV: 86.9 fl (ref 78.0–100.0)
Monocytes Absolute: 0.5 K/uL (ref 0.1–1.0)
Monocytes Relative: 9.9 % (ref 3.0–12.0)
Neutro Abs: 2.5 K/uL (ref 1.4–7.7)
Neutrophils Relative %: 55.8 % (ref 43.0–77.0)
Platelets: 242 K/uL (ref 150.0–400.0)
RBC: 4.38 Mil/uL (ref 3.87–5.11)
RDW: 13.2 % (ref 11.5–15.5)
WBC: 4.6 K/uL (ref 4.0–10.5)

## 2024-01-17 LAB — LIPID PANEL
Cholesterol: 198 mg/dL (ref 0–200)
HDL: 49 mg/dL (ref >39.00)
LDL Cholesterol: 133 mg/dL — ABNORMAL HIGH (ref 0–99)
NonHDL: 149.42
Total CHOL/HDL Ratio: 4
Triglycerides: 83 mg/dL (ref 0.0–149.0)
VLDL: 16.6 mg/dL (ref 0.0–40.0)

## 2024-01-17 LAB — TSH: TSH: 2.25 u[IU]/mL (ref 0.35–5.50)

## 2024-01-17 NOTE — Progress Notes (Signed)
 Subjective  Chief Complaint  Patient presents with   Annual Exam    HPI: Meghan Martinez is a 68 y.o. female who presents to [facility] [place] today for a Female Wellness Visit. She also has the concerns and/or needs as listed above in the chief complaint. These will be addressed in addition to the Health Maintenance Visit.   Wellness Visit: annual visit with health maintenance review and exam  HM: mammo due in August. Colonoscopy due again in 2027, 5 yr surveillance due to + FH; feels well. Works full time. Happy. Has lost a significant amount of weight. Chronic disease f/u and/or acute problem visit: (deemed necessary to be done in addition to the wellness visit): No problems. Takes vesicare  for urinary sxs. Works well. Osteopenia: due for recheck; last 2022.   Assessment  1. Encounter for well adult exam with abnormal findings   2. Family history of colon cancer   3. Family history of premature CAD   4. Mixed stress and urge urinary incontinence   5. Osteopenia, unspecified location      Plan  Female Wellness Visit: Age appropriate Health Maintenance and Prevention measures were discussed with patient. Included topics are cancer screening recommendations, ways to keep healthy (see AVS) including dietary and exercise recommendations, regular eye and dental care, use of seat belts, and avoidance of moderate alcohol use and tobacco use. Dexa and mammo due. ordered BMI: discussed patient's BMI and encouraged positive lifestyle modifications to help get to or maintain a target BMI. HM needs and immunizations were addressed and ordered. See below for orders. See HM and immunization section for updates. Routine labs and screening tests ordered including cmp, cbc and lipids where appropriate. Discussed recommendations regarding Vit D and calcium supplementation (see AVS)  Chronic disease management visit and/or acute problem visit: Vesicare  for OAB Follow up: 12 mo  for cpe  No orders of the defined types were placed in this encounter.  No orders of the defined types were placed in this encounter.     Body mass index is 30.95 kg/m. 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 03/19/2021   Osteopenia 02/04/2021    Dexa July 2022: lowest T = - 1.3, forearm; ostseopenia; vit d calcium; recheck 2-3 years    Obesity (BMI 30-39.9) 12/26/2019   Mixed stress and urge urinary incontinence 09/15/2017   Benign colon polyp 03/17/2016   Family history of colon cancer 08/22/2014   Family history of premature CAD 08/22/2014   Health Maintenance  Topic Date Due   DEXA SCAN  01/16/2024   COVID-19 Vaccine (4 - 2024-25 season) 02/02/2024 (Originally 03/13/2023)   INFLUENZA VACCINE  02/10/2024   MAMMOGRAM  03/09/2024   DTaP/Tdap/Td (2 - Td or Tdap) 11/25/2024   Colonoscopy  07/14/2025   Pneumococcal Vaccine: 50+ Years  Completed   Hepatitis C Screening  Completed   Zoster Vaccines- Shingrix   Completed   Hepatitis B Vaccines  Aged Out   HPV VACCINES  Aged Out   Meningococcal B Vaccine  Aged Out   Immunization History  Administered Date(s) Administered   PFIZER(Purple Top)SARS-COV-2 Vaccination 09/16/2019, 10/16/2019, 05/22/2020   Pneumococcal Conjugate-13 12/26/2020   Pneumococcal Polysaccharide-23 12/29/2021   Tdap 11/26/2014  Zoster Recombinant(Shingrix ) 12/25/2018, 06/26/2019   Zoster, Live 03/17/2016   We updated and reviewed the patient's past history in detail and it is documented below. Allergies: Patient has no known allergies. Past Medical History Patient  has a past medical history of COVID-19 (02/25/2021), History of chickenpox, Obesity (BMI 30-39.9) (12/26/2019), and Osteoarthritis of right hip (12/10/2020). Past Surgical  History Patient  has a past surgical history that includes Replacement total knee bilateral; Abdominal hysterectomy; Carpal tunnel release (Left); and Total hip arthroplasty (Right, 03/19/2021). Family History: Patient family history includes Colon cancer in her brother and mother; Lung cancer in her sister; Lung disease in her father; Non-Hodgkin's lymphoma in her brother. Social History:  Patient  reports that she has quit smoking. Her smoking use included cigars. She has never used smokeless tobacco. She reports current alcohol use. She reports that she does not use drugs.  Review of Systems: Constitutional: negative for fever or malaise Ophthalmic: negative for photophobia, double vision or loss of vision Cardiovascular: negative for chest pain, dyspnea on exertion, or new LE swelling Respiratory: negative for SOB or persistent cough Gastrointestinal: negative for abdominal pain, change in bowel habits or melena Genitourinary: negative for dysuria or gross hematuria, no abnormal uterine bleeding or disharge Musculoskeletal: negative for new gait disturbance or muscular weakness Integumentary: negative for new or persistent rashes, no breast lumps Neurological: negative for TIA or stroke symptoms Psychiatric: negative for SI or delusions Allergic/Immunologic: negative for hives  Patient Care Team    Relationship Specialty Notifications Start End  Jodie Lavern CROME, MD PCP - General Family Medicine  09/15/17   Ditty, Morene Hicks, MD Consulting Physician Neurosurgery  09/15/17     Objective  Vitals: Ht 5' 9 (1.753 m)   BMI 30.95 kg/m  General:  Well developed, well nourished, no acute distress  Psych:  Alert and orientedx3,normal mood and affect HEENT:  Normocephalic, atraumatic, non-icteric sclera,  supple neck without adenopathy, mass or thyromegaly Cardiovascular:  Normal S1, S2, RRR without gallop, rub or murmur Respiratory:  Good breath sounds bilaterally, CTAB with normal  respiratory effort Gastrointestinal: normal bowel sounds, soft, non-tender, no noted masses. No HSM MSK: extremities without edema, joints without erythema or swelling Neurologic:    Mental status is normal.  Gross motor and sensory exams are normal.  No tremor  Commons side effects, risks, benefits, and alternatives for medications and treatment plan prescribed today were discussed, and the patient expressed understanding of the given instructions. Patient is instructed to call or message via MyChart if he/she has any questions or concerns regarding our treatment plan. No barriers to understanding were identified. We discussed Red Flag symptoms and signs in detail. Patient expressed understanding regarding what to do in case of urgent or emergency type symptoms.  Medication list was reconciled, printed and provided to the patient in AVS. Patient instructions and summary information was reviewed with the patient as documented in the AVS. This note was prepared with assistance of Dragon voice recognition software. Occasional wrong-word or sound-a-like substitutions may have occurred due to the inherent limitations of voice recognition software

## 2024-01-17 NOTE — Patient Instructions (Addendum)
 Please return in 12 months for your annual complete physical; please come fasting.   I will release your lab results to you on your MyChart account with further instructions. You may see the results before I do, but when I review them I will send you a message with my report or have my assistant call you if things need to be discussed. Please reply to my message with any questions. Thank you!   If you have any questions or concerns, please don't hesitate to send me a message via MyChart or call the office at [phone]. Thank you for visiting with us  today! It's our pleasure caring for you.   Please call the office checked below to schedule your appointment for your mammogram and/or bone density screen (the checked studies were ordered): [x]   Mammogram  [x]   Bone Density  [x]   [facility]     [address] [place], [place]        [phone]         []   [facility]  [address] [place], [place]  [phone]  Preventive Care 65 Years and Older, Female Preventive care refers to lifestyle choices and visits with your health care provider that can promote health and wellness. Preventive care visits are also called wellness exams. What can I expect for my preventive care visit? Counseling Your health care provider may ask you questions about your: Medical history, including: Past medical problems. Family medical history. Pregnancy and menstrual history. History of falls. Current health, including: Memory and ability to understand (cognition). Emotional well-being. Home life and relationship well-being. Sexual activity and sexual health. Lifestyle, including: Alcohol, nicotine or tobacco, and drug use. Access to firearms. Diet, exercise, and sleep habits. Work and work Astronomer. Sunscreen use. Safety issues such as seatbelt and bike helmet use. Physical exam Your health care provider will check your: Height and weight. These  may be used to calculate your BMI (body mass index). BMI is a measurement that tells if you are at a healthy weight. Waist circumference. This measures the distance around your waistline. This measurement also tells if you are at a healthy weight and may help predict your risk of certain diseases, such as type 2 diabetes and high blood pressure. Heart rate and blood pressure. Body temperature. Skin for abnormal spots. What immunizations do I need?  Vaccines are usually given at various ages, according to a schedule. Your health care provider will recommend vaccines for you based on your age, medical history, and lifestyle or other factors, such as travel or where you work. What tests do I need? Screening Your health care provider may recommend screening tests for certain conditions. This may include: Lipid and cholesterol levels. Hepatitis C test. Hepatitis B test. HIV (human immunodeficiency virus) test. STI (sexually transmitted infection) testing, if you are at risk. Lung cancer screening. Colorectal cancer screening. Diabetes screening. This is done by checking your blood sugar (glucose) after you have not eaten for a while (fasting). Mammogram. Talk with your health care provider about how often you should have regular mammograms. BRCA-related cancer screening. This may be done if you have a family history of breast, ovarian, tubal, or peritoneal cancers. Bone density scan. This is done to screen for osteoporosis. Talk with your health care provider about your test results, treatment options, and if necessary, the need for more tests. Follow these instructions at home: Eating and drinking  Eat a diet  that includes fresh fruits and vegetables, whole grains, lean protein, and low-fat dairy products. Limit your intake of foods with high amounts of sugar, saturated fats, and salt. Take vitamin and mineral supplements as recommended by your health care provider. Do not drink alcohol if  your health care provider tells you not to drink. If you drink alcohol: Limit how much you have to 0-1 drink a day. Know how much alcohol is in your drink. In the [place] one drink equals one 12 oz bottle of beer (355 mL), one 5 oz glass of wine (148 mL), or one 1 oz glass of hard liquor (44 mL). Lifestyle Brush your teeth every morning and night with fluoride toothpaste. Floss one time each day. Exercise for at least 30 minutes 5 or more days each week. Do not use any products that contain nicotine or tobacco. These products include cigarettes, chewing tobacco, and vaping devices, such as e-cigarettes. If you need help quitting, ask your health care provider. Do not use drugs. If you are sexually active, practice safe sex. Use a condom or other form of protection in order to prevent STIs. Take aspirin  only as told by your health care provider. Make sure that you understand how much to take and what form to take. Work with your health care provider to find out whether it is safe and beneficial for you to take aspirin  daily. Ask your health care provider if you need to take a cholesterol-lowering medicine (statin). Find healthy ways to manage stress, such as: Meditation, yoga, or listening to music. Journaling. Talking to a trusted person. Spending time with friends and family. Minimize exposure to UV radiation to reduce your risk of skin cancer. Safety Always wear your seat belt while driving or riding in a vehicle. Do not drive: If you have been drinking alcohol. Do not ride with someone who has been drinking. When you are tired or distracted. While texting. If you have been using any mind-altering substances or drugs. Wear a helmet and other protective equipment during sports activities. If you have firearms in your house, make sure you follow all gun safety procedures. What's next? Visit your health care provider once a year for an annual wellness visit. Ask your health care provider  how often you should have your eyes and teeth checked. Stay up to date on all vaccines. This information is not intended to replace advice given to you by your health care provider. Make sure you discuss any questions you have with your health care provider. Document Revised: 12/24/2020 Document Reviewed: 12/24/2020 Elsevier Patient Education  2024 [employer].

## 2024-01-18 ENCOUNTER — Ambulatory Visit: Payer: Self-pay | Admitting: Family Medicine

## 2024-01-18 NOTE — Progress Notes (Signed)
 Labs reviewed.  The 10-year ASCVD risk score (Arnett DK, et al., 2019) is: 7.2%   Values used to calculate the score:     Age: 68 years     Clincally relevant sex: Female     Is Non-Hispanic African American: No     Diabetic: No     Tobacco smoker: No     Systolic Blood Pressure: 122 mmHg     Is BP treated: No     HDL Cholesterol: 49 mg/dL     Total Cholesterol: 198 mg/dL

## 2024-02-16 ENCOUNTER — Other Ambulatory Visit: Payer: Self-pay | Admitting: Family Medicine

## 2024-02-16 DIAGNOSIS — Z1231 Encounter for screening mammogram for malignant neoplasm of breast: Secondary | ICD-10-CM

## 2024-03-13 ENCOUNTER — Ambulatory Visit
Admission: RE | Admit: 2024-03-13 | Discharge: 2024-03-13 | Disposition: A | Discharge status: Home / Self Care | Source: Ambulatory Visit | Attending: Family Medicine | Admitting: Family Medicine

## 2024-03-13 DIAGNOSIS — Z1231 Encounter for screening mammogram for malignant neoplasm of breast: Secondary | ICD-10-CM

## 2024-03-16 ENCOUNTER — Other Ambulatory Visit: Payer: Self-pay | Admitting: Family Medicine

## 2024-03-16 DIAGNOSIS — R928 Other abnormal and inconclusive findings on diagnostic imaging of breast: Secondary | ICD-10-CM

## 2024-05-31 ENCOUNTER — Inpatient Hospital Stay: Admission: RE | Admit: 2024-05-31 | Discharge: 2024-05-31 | Attending: Family Medicine | Admitting: Family Medicine

## 2024-05-31 ENCOUNTER — Other Ambulatory Visit: Payer: Self-pay | Admitting: Family Medicine

## 2024-05-31 ENCOUNTER — Ambulatory Visit
Admission: RE | Admit: 2024-05-31 | Discharge: 2024-05-31 | Disposition: A | Discharge status: Home / Self Care | Source: Ambulatory Visit | Attending: Family Medicine | Admitting: Family Medicine

## 2024-05-31 DIAGNOSIS — R928 Other abnormal and inconclusive findings on diagnostic imaging of breast: Secondary | ICD-10-CM

## 2024-05-31 DIAGNOSIS — M7989 Other specified soft tissue disorders: Secondary | ICD-10-CM

## 2024-06-13 ENCOUNTER — Other Ambulatory Visit: Payer: Self-pay | Admitting: Family Medicine

## 2024-09-03 ENCOUNTER — Other Ambulatory Visit

## 2024-10-10 ENCOUNTER — Other Ambulatory Visit (HOSPITAL_BASED_OUTPATIENT_CLINIC_OR_DEPARTMENT_OTHER)

## 2025-01-17 ENCOUNTER — Encounter: Admitting: Family Medicine
